# Patient Record
Sex: Female | Born: 1980 | Race: Black or African American | Hispanic: No | Marital: Married | Smoking: Never smoker
Health system: Southern US, Community
[De-identification: ages and names within clinical notes are randomized; demographics above are authoritative.]

## PROBLEM LIST (undated history)

## (undated) DIAGNOSIS — C50912 Malignant neoplasm of unspecified site of left female breast: Secondary | ICD-10-CM

## (undated) DIAGNOSIS — I509 Heart failure, unspecified: Secondary | ICD-10-CM

## (undated) DIAGNOSIS — I158 Other secondary hypertension: Secondary | ICD-10-CM

## (undated) DIAGNOSIS — D649 Anemia, unspecified: Secondary | ICD-10-CM

## (undated) DIAGNOSIS — T50905A Adverse effect of unspecified drugs, medicaments and biological substances, initial encounter: Secondary | ICD-10-CM

## (undated) HISTORY — PX: BREAST BIOPSY: SHX20

---
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
## 2013-04-18 ENCOUNTER — Ambulatory Visit (INDEPENDENT_AMBULATORY_CARE_PROVIDER_SITE_OTHER): Payer: 59 | Admitting: Family Medicine

## 2013-04-18 VITALS — BP 110/78 | HR 80 | Temp 98.1°F | Resp 18 | Ht 63.0 in | Wt 151.0 lb

## 2013-04-18 DIAGNOSIS — R51 Headache: Secondary | ICD-10-CM

## 2013-04-18 DIAGNOSIS — N912 Amenorrhea, unspecified: Secondary | ICD-10-CM

## 2013-04-18 DIAGNOSIS — H538 Other visual disturbances: Secondary | ICD-10-CM

## 2013-04-18 LAB — GLUCOSE, POCT (MANUAL RESULT ENTRY): POC Glucose: 70 mg/dl (ref 70–99)

## 2013-04-18 LAB — POCT URINE PREGNANCY: Preg Test, Ur: NEGATIVE

## 2013-04-18 NOTE — Patient Instructions (Addendum)
Take Tylenol or ibuprofen for the headache  Return at any time if worse  We are scheduling you for a CT scan of your head, and someone should contact you in the next day or so as to when that is scheduled for.

## 2013-04-18 NOTE — Progress Notes (Signed)
Subjective: 32 year old female who has a history of having headaches for the past month. She had some visual problems 2 so she saw an eye doctor and got glasses. She's been wearing the glasses. She was doing okay with them, but her last week she's noticed more visual blurring on and off. She has intermittent headaches, more on the right parietal occipital area. No nausea or vomiting. She says her menses are very irregular, that being tired is a possibility and her. She has been under some stress with family members staying with them currently, but it's not been any problem. 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 She has not had any slurring of speech or coordination issues. The headache has been persistent but not accelerating.  Objective: Her TMs are normal. Eyes PERRLA. EOMs intact. Fundi benign with discs sharp. Throat clear. Neck supple without nodes thyromegaly. No carotid bruits. Chest clear. Heart regular without murmurs. Romberg negative. Finger to nose normal. Tandem walk normal. Cranial nerves grossly intact.  Assessment: Headache Visual blurring  Plan: Check a urine hCG to make sure she's not pregnant. Check glucose on her. Will then try and decide whether she needs a scan are not.   Results for orders placed in visit on 04/18/13  POCT URINE PREGNANCY      Result Value Range   Preg Test, Ur Negative    GLUCOSE, POCT (MANUAL RESULT ENTRY)      Result Value Range   POC Glucose 70  70 - 99 mg/dl   see orders. CT scan of head ordered.

## 2013-04-25 ENCOUNTER — Ambulatory Visit
Admission: RE | Admit: 2013-04-25 | Discharge: 2013-04-25 | Disposition: A | Discharge status: Home / Self Care | Payer: 59 | Source: Ambulatory Visit | Attending: Family Medicine | Admitting: Family Medicine

## 2013-04-25 DIAGNOSIS — R51 Headache: Secondary | ICD-10-CM

## 2013-04-25 DIAGNOSIS — H538 Other visual disturbances: Secondary | ICD-10-CM

## 2015-07-03 ENCOUNTER — Emergency Department (HOSPITAL_COMMUNITY)
Admission: EM | Admit: 2015-07-03 | Discharge: 2015-07-03 | Discharge status: Against Medical Advice | Payer: Managed Care, Other (non HMO) | Attending: Emergency Medicine | Admitting: Emergency Medicine

## 2015-07-03 ENCOUNTER — Encounter (HOSPITAL_COMMUNITY): Payer: Self-pay | Admitting: Emergency Medicine

## 2015-07-03 ENCOUNTER — Encounter (HOSPITAL_COMMUNITY): Payer: Self-pay

## 2015-07-03 ENCOUNTER — Inpatient Hospital Stay (HOSPITAL_COMMUNITY)
Admission: AD | Admit: 2015-07-03 | Discharge: 2015-07-03 | Disposition: A | Discharge status: Home / Self Care | Payer: Managed Care, Other (non HMO) | Source: Ambulatory Visit | Attending: Obstetrics & Gynecology | Admitting: Obstetrics & Gynecology

## 2015-07-03 DIAGNOSIS — G8918 Other acute postprocedural pain: Secondary | ICD-10-CM

## 2015-07-03 DIAGNOSIS — R109 Unspecified abdominal pain: Secondary | ICD-10-CM | POA: Insufficient documentation

## 2015-07-03 LAB — URINALYSIS, ROUTINE W REFLEX MICROSCOPIC
Bilirubin Urine: NEGATIVE
Glucose, UA: NEGATIVE mg/dL
Hgb urine dipstick: NEGATIVE
Ketones, ur: NEGATIVE mg/dL
LEUKOCYTES UA: NEGATIVE
NITRITE: NEGATIVE
Protein, ur: NEGATIVE mg/dL
SPECIFIC GRAVITY, URINE: 1.025 (ref 1.005–1.030)
UROBILINOGEN UA: 0.2 mg/dL (ref 0.0–1.0)
pH: 6 (ref 5.0–8.0)

## 2015-07-03 NOTE — ED Notes (Signed)
PT DECIDED TO LEAVE AMA.  STATED SHE WAS GOING TO CALL 911 FROM HOME.

## 2015-07-03 NOTE — MAU Note (Signed)
Pt states she had a c-section Monday at [facility]. Felt burning across her stomach and "something split internally" an hour ago. Originally went to [facility] and was told to come here.

## 2015-07-03 NOTE — Discharge Instructions (Signed)

## 2015-07-03 NOTE — MAU Provider Note (Signed)
  History     CSN: [id number]  Arrival date and time: 07/03/15 0109   First Provider Initiated Contact with Patient 07/03/15 0335      No chief complaint on file.  HPI Comments: Darlene Kline is a 34 y.o. [id number] who presents today with abdominal pain. She had a repeat c-section on Monday at [facility] with Dr. Earleen Newport. She states that tonight she had a "tearing  Pain". She states that she last took ibuprofen at 1800 yesterday. She has percocet for pain, but she has not taken any of that yesterday or today (8/11 or 8/12). She also states that she has been "doing a lot" at home. She states that her bleeding is very minimal. She denies any fever.   Abdominal Pain This is a new problem. The current episode started today. The onset quality is sudden. The problem has been gradually improving. The pain is located in the suprapubic region. The pain is at a severity of 5/10. The quality of the pain is sharp. The abdominal pain does not radiate. Associated symptoms include nausea. Pertinent negatives include no constipation, diarrhea, dysuria, fever, frequency or vomiting. The pain is aggravated by movement. The pain is relieved by recumbency. She has tried nothing for the symptoms. Her past medical history is significant for abdominal surgery.     No past medical history on file.  Past Surgical History  Procedure Laterality Date  . Cesarean section      No family history on file.  Social History  Substance Use Topics  . Smoking status: Never Smoker   . Smokeless tobacco: Not on file  . Alcohol Use: No    Allergies: No Known Allergies  No prescriptions prior to admission    Review of Systems  Constitutional: Negative for fever.  Gastrointestinal: Positive for nausea and abdominal pain. Negative for vomiting, diarrhea and constipation.  Genitourinary: Negative for dysuria, urgency and frequency.   Physical Exam   Blood pressure 139/90, pulse 87, temperature 98.3 F (36.8  C), temperature source Oral, resp. rate 16, height 5\' 2"  (1.575 m), weight 83.643 kg (184 lb 6.4 oz), SpO2 100 %.  Physical Exam  Nursing note and vitals reviewed. Constitutional: She is oriented to person, place, and time. She appears well-developed and well-nourished. No distress.  HENT:  Head: Normocephalic.  Cardiovascular: Normal rate.   Respiratory: Effort normal.  GI: Soft. There is no tenderness. There is no rebound.  c-section incision is well healed.   Neurological: She is alert and oriented to person, place, and time.  Skin: Skin is warm and dry.  Psychiatric: She has a normal mood and affect.    MAU Course  Procedures  MDM Patient offered pain medication here. She states that she would rather go home and take the medication she has at home.   Assessment and Plan   1. Post-op pain    Advised patient to decrease activity and increase rest Take ibuprofen on a schedule, and take percocet as needed Call OB in the morning if pain persists  DC home Comfort measures reviewed  RX: none, continue meds at home as prescribed.  Return to MAU as needed FU with OB as planned  Follow-up Information    Call Darlene Braun, MD.   Specialty:  Obstetrics and Gynecology   Contact information:   [address] [place] [address] [phone]          Mathis Bud 07/03/2015, 3:36 AM

## 2015-07-03 NOTE — ED Notes (Signed)
Pt. reports low abdominal pain with nausea onset onset this evening , denies emesis or diarrhea , no fever or chills.

## 2015-07-03 NOTE — MAU Note (Signed)
Pt offered pain medication here by CNM but states she has some at home and would rather take it there

## 2016-11-21 HISTORY — PX: PORTA CATH INSERTION: CATH118285

## 2017-07-04 DIAGNOSIS — N6342 Unspecified lump in left breast, subareolar: Secondary | ICD-10-CM | POA: Insufficient documentation

## 2017-07-13 DIAGNOSIS — R928 Other abnormal and inconclusive findings on diagnostic imaging of breast: Secondary | ICD-10-CM | POA: Insufficient documentation

## 2017-08-02 DIAGNOSIS — B3731 Acute candidiasis of vulva and vagina: Secondary | ICD-10-CM | POA: Insufficient documentation

## 2017-08-02 DIAGNOSIS — N898 Other specified noninflammatory disorders of vagina: Secondary | ICD-10-CM | POA: Insufficient documentation

## 2017-08-02 DIAGNOSIS — B373 Candidiasis of vulva and vagina: Secondary | ICD-10-CM | POA: Insufficient documentation

## 2017-08-09 DIAGNOSIS — K219 Gastro-esophageal reflux disease without esophagitis: Secondary | ICD-10-CM | POA: Insufficient documentation

## 2017-08-09 DIAGNOSIS — R928 Other abnormal and inconclusive findings on diagnostic imaging of breast: Secondary | ICD-10-CM | POA: Insufficient documentation

## 2017-09-01 DIAGNOSIS — Z09 Encounter for follow-up examination after completed treatment for conditions other than malignant neoplasm: Secondary | ICD-10-CM | POA: Insufficient documentation

## 2017-09-01 DIAGNOSIS — E860 Dehydration: Secondary | ICD-10-CM | POA: Insufficient documentation

## 2017-12-15 ENCOUNTER — Other Ambulatory Visit: Payer: Self-pay | Admitting: General Surgery

## 2017-12-15 ENCOUNTER — Ambulatory Visit
Admission: RE | Admit: 2017-12-15 | Discharge: 2017-12-15 | Disposition: A | Discharge status: Home / Self Care | Payer: Managed Care, Other (non HMO) | Source: Ambulatory Visit | Attending: General Surgery | Admitting: General Surgery

## 2017-12-15 DIAGNOSIS — Z853 Personal history of malignant neoplasm of breast: Secondary | ICD-10-CM

## 2017-12-20 ENCOUNTER — Other Ambulatory Visit: Payer: Self-pay | Admitting: General Surgery

## 2017-12-21 NOTE — H&P (Signed)
Subjective:     Patient ID: Darlene Kline is a 37 y.o. female.  HPI  Here for follow up discussion breast reconstruction prior to planned bilateral mastectomies. Presented with palpable left breast mass. MMG revealed a mass in the left retroareolar region with microcalcifications extending from the mass to the base of the nipple spanning 4.8 cm from the posterior border of the mass to the nipple base.US showed an irregular mass at the 530 position of the left breast 2 cmfn measuring 1.8 x 2.9 x 3 cm with  duct extension to the nipple base. An additional mass at 3 o'clock position of the left breast 1 cmfn measuring 0.7 x 1.3 x 1.7 cm located 1.2 cm from the larger mass. [place] of axilla demonstrated at least 2 abnormal LN. Biopsy of 5:30 mass with IDC, ER/PR+, Her2 -. LN biopsy positive for metastatic disease.  invasive ductal carcinoma at the 530 o'clock position. Additional biopsy 2 o clock with UDH.   Genetics with VUS CHEK2. Staging scans negative for distant disease.  Neoadjuvant chemotherapy completed 1.18.19. Final MRI noted indeterminate 5 mm enhancing mass in UIQ RIGHT breast; second look [place] recommended. Near complete resolution of the enhancing masses in the left breast and normal size of the abnormal left axillary LN.  PMRT is anticipated, pending final pathology.  Current 36 C, notes left larger than left and reports right "more like a B." Wt up 10 lb since diagnosis.  Lives with husband and keds ages 41 and 43. 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  Review of Systems     Objective:   Physical Exam  Cardiovascular: Normal rate, regular rhythm and normal heart sounds.   Pulmonary/Chest: Effort normal and breath sounds normal.  Abdominal: Soft.  Striae present, umbilical piercing present, no panniculus, small volume soft tissue  Lymphadenopathy:    She has no axillary adenopathy.  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 Discussed used of ADM in reconstruction, cadaveric source. Reviewed risks of ADM including seroma, in case of infection may require removal. Reviewed data suggests that prepectoral placement with ADM may incur less of radiated associated complications.  Reviewed incisions, drains, OR length, hospital stay and post procedure limitations. Discussed process of expansion and implant based risks including rupture, MRI surveillance for silicone implants, infection requiring surgery or removal, contracture.   Discussed future surgery dependent on adjuvant treatments. This includes radiation- discussed this significantly increases risk reconstruction including wound healing problems capsular contracture. Expanders will likely be in place for several months (approximately 6 months from end of radiation) before continuing reconstruction process. At that time could do implant exchange alone, implant exchange with LD flap for radiated chest, or coversion to autologous.   Reviewed SSM will be asensate and not stimulate. Reviewed risk mastectomy flap necrosis requiring additional surgery.  Rx for post mastectomy supplies given.   Irene Limbo, MD [facility] Plastic & Reconstructive Surgery [phone], pin [phone]

## 2017-12-29 NOTE — Pre-Procedure Instructions (Signed)
Darlene Kline  12/29/2017      [id number] - [place], [place] - [address] AT [address] [place] [address] Phone: [phone] Fax: [phone]    Your procedure is scheduled on February 15  Report to [facility] at 1025 A.M.  Call this number if you have problems the morning of surgery:  [phone]   Remember:  Do not eat food or drink liquids after midnight.   Continue all medications as directed by your physician except follow these medication instructions before surgery below   Take these medicines the morning of surgery with A SIP OF WATER loratadine (CLARITIN)  7 days prior to surgery STOP taking any Aspirin(unless otherwise instructed by your surgeon), Aleve, Naproxen, Ibuprofen, Motrin, Advil, Goody's, BC's, all herbal medications, fish oil, and all vitamins     Do not wear jewelry, make-up or nail polish.  Do not wear lotions, powders, or perfumes, or deodorant.  Do not shave 48 hours prior to surgery.  Men may shave face and neck.  Do not bring valuables to the hospital.  [facility] is not responsible for any belongings or valuables.  Contacts, dentures or bridgework may not be worn into surgery.  Leave your suitcase in the car.  After surgery it may be brought to your room.  For patients admitted to the hospital, discharge time will be determined by your treatment team.  Patients discharged the day of surgery will not be allowed to drive home.    Special instructions:   [facility]- Preparing For Surgery  Before surgery, you can play an important role. Because skin is not sterile, your skin needs to be as free of germs as possible. You can reduce the number of germs on your skin by washing with CHG (chlorahexidine gluconate) Soap before surgery.  CHG is an antiseptic cleaner which kills germs and bonds with the skin to continue killing germs even after washing.  Please do  not use if you have an allergy to CHG or antibacterial soaps. If your skin becomes reddened/irritated stop using the CHG.  Do not shave (including legs and underarms) for at least 48 hours prior to first CHG shower. It is OK to shave your face.  Please follow these instructions carefully.   1. Shower the NIGHT BEFORE SURGERY and the MORNING OF SURGERY with CHG.   2. If you chose to wash your hair, wash your hair first as usual with your normal shampoo.  3. After you shampoo, rinse your hair and body thoroughly to remove the shampoo.  4. Use CHG as you would any other liquid soap. You can apply CHG directly to the skin and wash gently with a scrungie or a clean washcloth.   5. Apply the CHG Soap to your body ONLY FROM THE NECK DOWN.  Do not use on open wounds or open sores. Avoid contact with your eyes, ears, mouth and genitals (private parts). Wash Face and genitals (private parts)  with your normal soap.  6. Wash thoroughly, paying special attention to the area where your surgery will be performed.  7. Thoroughly rinse your body with warm water from the neck down.  8. DO NOT shower/wash with your normal soap after using and rinsing off the CHG Soap.  9. Pat yourself dry with a CLEAN TOWEL.  10. Wear CLEAN PAJAMAS to bed the night before surgery, wear comfortable clothes the morning of surgery  11. Place CLEAN SHEETS  on your bed the night of your first shower and DO NOT SLEEP WITH PETS.    Day of Surgery: Do not apply any deodorants/lotions. Please wear clean clothes to the hospital/surgery center.      Please read over the following fact sheets that you were given.

## 2018-01-01 ENCOUNTER — Encounter (HOSPITAL_COMMUNITY): Payer: Self-pay | Admitting: Surgery

## 2018-01-01 ENCOUNTER — Other Ambulatory Visit: Payer: Self-pay

## 2018-01-01 ENCOUNTER — Encounter (HOSPITAL_COMMUNITY)
Admission: RE | Admit: 2018-01-01 | Discharge: 2018-01-01 | Disposition: A | Discharge status: Home / Self Care | Payer: Managed Care, Other (non HMO) | Source: Ambulatory Visit | Attending: General Surgery | Admitting: General Surgery

## 2018-01-01 DIAGNOSIS — C50912 Malignant neoplasm of unspecified site of left female breast: Secondary | ICD-10-CM | POA: Diagnosis not present

## 2018-01-01 DIAGNOSIS — Z01812 Encounter for preprocedural laboratory examination: Secondary | ICD-10-CM | POA: Diagnosis present

## 2018-01-01 LAB — HEMOGLOBIN: Hemoglobin: 9.8 g/dL — ABNORMAL LOW (ref 12.0–15.0)

## 2018-01-01 NOTE — Progress Notes (Signed)
Patient is aware of change in surgery date and time  Instructed patient to arrive at 40 on the 14th

## 2018-01-01 NOTE — Progress Notes (Signed)
PCP -  Nelva Bush Cardiologist - none  Chest x-ray - not needed EKG - not needed  Anesthesia review:  No  Patient denies shortness of breath, fever, cough and chest pain at PAT appointment   Patient verbalized understanding of instructions that were given to them at the PAT appointment. Patient was also instructed that they will need to review over the PAT instructions again at home before surgery.

## 2018-01-04 ENCOUNTER — Ambulatory Visit (HOSPITAL_COMMUNITY): Payer: Managed Care, Other (non HMO) | Admitting: Anesthesiology

## 2018-01-04 ENCOUNTER — Other Ambulatory Visit: Payer: Self-pay

## 2018-01-04 ENCOUNTER — Observation Stay (HOSPITAL_COMMUNITY)
Admission: RE | Admit: 2018-01-04 | Discharge: 2018-01-06 | Disposition: A | Discharge status: Home / Self Care | Payer: Managed Care, Other (non HMO) | Source: Ambulatory Visit | Attending: General Surgery | Admitting: General Surgery

## 2018-01-04 ENCOUNTER — Encounter (HOSPITAL_COMMUNITY): Payer: Self-pay | Admitting: *Deleted

## 2018-01-04 ENCOUNTER — Encounter (HOSPITAL_COMMUNITY)
Admission: RE | Disposition: A | Discharge status: Home / Self Care | Payer: Self-pay | Source: Ambulatory Visit | Attending: General Surgery

## 2018-01-04 DIAGNOSIS — C50412 Malignant neoplasm of upper-outer quadrant of left female breast: Principal | ICD-10-CM | POA: Insufficient documentation

## 2018-01-04 DIAGNOSIS — D6489 Other specified anemias: Secondary | ICD-10-CM | POA: Diagnosis not present

## 2018-01-04 DIAGNOSIS — Z4001 Encounter for prophylactic removal of breast: Secondary | ICD-10-CM | POA: Insufficient documentation

## 2018-01-04 DIAGNOSIS — D62 Acute posthemorrhagic anemia: Secondary | ICD-10-CM | POA: Insufficient documentation

## 2018-01-04 DIAGNOSIS — Z9221 Personal history of antineoplastic chemotherapy: Secondary | ICD-10-CM | POA: Diagnosis not present

## 2018-01-04 DIAGNOSIS — C773 Secondary and unspecified malignant neoplasm of axilla and upper limb lymph nodes: Secondary | ICD-10-CM | POA: Diagnosis not present

## 2018-01-04 DIAGNOSIS — Z791 Long term (current) use of non-steroidal anti-inflammatories (NSAID): Secondary | ICD-10-CM | POA: Insufficient documentation

## 2018-01-04 DIAGNOSIS — Z79899 Other long term (current) drug therapy: Secondary | ICD-10-CM | POA: Insufficient documentation

## 2018-01-04 DIAGNOSIS — N6091 Unspecified benign mammary dysplasia of right breast: Secondary | ICD-10-CM | POA: Diagnosis not present

## 2018-01-04 DIAGNOSIS — Z170 Estrogen receptor positive status [ER+]: Secondary | ICD-10-CM | POA: Diagnosis not present

## 2018-01-04 DIAGNOSIS — C50911 Malignant neoplasm of unspecified site of right female breast: Secondary | ICD-10-CM | POA: Diagnosis present

## 2018-01-04 DIAGNOSIS — R509 Fever, unspecified: Secondary | ICD-10-CM | POA: Diagnosis not present

## 2018-01-04 DIAGNOSIS — D649 Anemia, unspecified: Secondary | ICD-10-CM | POA: Insufficient documentation

## 2018-01-04 HISTORY — PX: MASTECTOMY: SHX3

## 2018-01-04 HISTORY — DX: Malignant neoplasm of unspecified site of left female breast: C50.912

## 2018-01-04 HISTORY — PX: NIPPLE SPARING MASTECTOMY: SHX6537

## 2018-01-04 HISTORY — PX: RECONSTRUCTION BREAST IMMEDIATE / DELAYED W/ TISSUE EXPANDER: SUR1077

## 2018-01-04 HISTORY — PX: MASTECTOMY MODIFIED RADICAL: SHX5962

## 2018-01-04 LAB — POCT PREGNANCY, URINE: PREG TEST UR: NEGATIVE

## 2018-01-04 SURGERY — MASTECTOMY, MODIFIED RADICAL
Anesthesia: General | Site: Breast | Laterality: Right

## 2018-01-04 MED ORDER — SUGAMMADEX SODIUM 200 MG/2ML IV SOLN
INTRAVENOUS | Status: AC
  Filled 2018-01-04: qty 2

## 2018-01-04 MED ORDER — FENTANYL CITRATE (PF) 100 MCG/2ML IJ SOLN
INTRAMUSCULAR | Status: DC | PRN
  Administered 2018-01-04: 50 ug via INTRAVENOUS
  Administered 2018-01-04: 25 ug via INTRAVENOUS
  Administered 2018-01-04 (×5): 50 ug via INTRAVENOUS
  Administered 2018-01-04: 25 ug via INTRAVENOUS

## 2018-01-04 MED ORDER — ONDANSETRON HCL 4 MG/2ML IJ SOLN
4.0000 mg | Freq: Four times a day (QID) | INTRAMUSCULAR | Status: DC | PRN
  Administered 2018-01-04 – 2018-01-06 (×2): 4 mg via INTRAVENOUS
  Filled 2018-01-04 (×2): qty 2

## 2018-01-04 MED ORDER — CHLORHEXIDINE GLUCONATE CLOTH 2 % EX PADS: 6.0000 | MEDICATED_PAD | Freq: Once | CUTANEOUS | Status: DC

## 2018-01-04 MED ORDER — HYDROMORPHONE HCL 1 MG/ML IJ SOLN
INTRAMUSCULAR | Status: AC
  Administered 2018-01-04: 0.5 mg via INTRAVENOUS
  Filled 2018-01-04: qty 1

## 2018-01-04 MED ORDER — CEFAZOLIN SODIUM 1 G IJ SOLR
INTRAMUSCULAR | Status: AC
  Filled 2018-01-04: qty 20

## 2018-01-04 MED ORDER — DEXAMETHASONE SODIUM PHOSPHATE 10 MG/ML IJ SOLN
INTRAMUSCULAR | Status: AC
  Filled 2018-01-04: qty 1

## 2018-01-04 MED ORDER — OXYCODONE HCL 5 MG PO TABS
ORAL_TABLET | ORAL | Status: AC
  Filled 2018-01-04: qty 2

## 2018-01-04 MED ORDER — EPHEDRINE SULFATE-NACL 50-0.9 MG/10ML-% IV SOSY
PREFILLED_SYRINGE | INTRAVENOUS | Status: DC | PRN
  Administered 2018-01-04 (×4): 5 mg via INTRAVENOUS

## 2018-01-04 MED ORDER — MEPERIDINE HCL 50 MG/ML IJ SOLN: 6.2500 mg | INTRAMUSCULAR | Status: DC | PRN

## 2018-01-04 MED ORDER — GABAPENTIN 300 MG PO CAPS
300.0000 mg | ORAL_CAPSULE | ORAL | Status: AC
  Administered 2018-01-04: 300 mg via ORAL

## 2018-01-04 MED ORDER — HYDROMORPHONE HCL 1 MG/ML IJ SOLN
0.2500 mg | INTRAMUSCULAR | Status: DC | PRN
  Administered 2018-01-04 (×3): 0.5 mg via INTRAVENOUS

## 2018-01-04 MED ORDER — OXYCODONE HCL 5 MG PO TABS: 5.0000 mg | ORAL_TABLET | Freq: Once | ORAL | Status: DC | PRN

## 2018-01-04 MED ORDER — SUGAMMADEX SODIUM 200 MG/2ML IV SOLN
INTRAVENOUS | Status: DC | PRN
  Administered 2018-01-04: 200 mg via INTRAVENOUS

## 2018-01-04 MED ORDER — KETOROLAC TROMETHAMINE 15 MG/ML IJ SOLN
INTRAMUSCULAR | Status: AC
  Administered 2018-01-04: 15 mg via INTRAVENOUS
  Filled 2018-01-04: qty 1

## 2018-01-04 MED ORDER — KETOROLAC TROMETHAMINE 15 MG/ML IJ SOLN
15.0000 mg | INTRAMUSCULAR | Status: DC
  Administered 2018-01-04: 15 mg via INTRAVENOUS

## 2018-01-04 MED ORDER — GABAPENTIN 300 MG PO CAPS
ORAL_CAPSULE | ORAL | Status: AC
  Administered 2018-01-04: 300 mg via ORAL
  Filled 2018-01-04: qty 1

## 2018-01-04 MED ORDER — HEMOSTATIC AGENTS (NO CHARGE) OPTIME
TOPICAL | Status: DC | PRN
  Administered 2018-01-04: 1 via TOPICAL

## 2018-01-04 MED ORDER — ROCURONIUM BROMIDE 100 MG/10ML IV SOLN
INTRAVENOUS | Status: DC | PRN
  Administered 2018-01-04: 20 mg via INTRAVENOUS
  Administered 2018-01-04: 50 mg via INTRAVENOUS
  Administered 2018-01-04: 10 mg via INTRAVENOUS

## 2018-01-04 MED ORDER — SODIUM CHLORIDE 0.9 % IJ SOLN
INTRAMUSCULAR | Status: AC
  Filled 2018-01-04: qty 10

## 2018-01-04 MED ORDER — OXYCODONE HCL 5 MG PO TABS
5.0000 mg | ORAL_TABLET | ORAL | Status: DC | PRN
  Administered 2018-01-04 (×2): 10 mg via ORAL
  Administered 2018-01-05: 5 mg via ORAL
  Administered 2018-01-05 – 2018-01-06 (×9): 10 mg via ORAL
  Filled 2018-01-04 (×11): qty 2

## 2018-01-04 MED ORDER — MIDAZOLAM HCL 2 MG/2ML IJ SOLN
INTRAMUSCULAR | Status: AC
  Filled 2018-01-04: qty 2

## 2018-01-04 MED ORDER — ENOXAPARIN SODIUM 40 MG/0.4ML ~~LOC~~ SOLN
40.0000 mg | SUBCUTANEOUS | Status: DC
  Administered 2018-01-04: 40 mg via SUBCUTANEOUS
  Filled 2018-01-04: qty 0.4

## 2018-01-04 MED ORDER — PROMETHAZINE HCL 25 MG/ML IJ SOLN
6.2500 mg | INTRAMUSCULAR | Status: DC | PRN
  Administered 2018-01-04: 6.25 mg via INTRAVENOUS

## 2018-01-04 MED ORDER — METHOCARBAMOL 500 MG PO TABS
500.0000 mg | ORAL_TABLET | Freq: Four times a day (QID) | ORAL | Status: DC | PRN
  Administered 2018-01-04 – 2018-01-06 (×3): 500 mg via ORAL
  Filled 2018-01-04 (×2): qty 1

## 2018-01-04 MED ORDER — PHENYLEPHRINE 40 MCG/ML (10ML) SYRINGE FOR IV PUSH (FOR BLOOD PRESSURE SUPPORT)
PREFILLED_SYRINGE | INTRAVENOUS | Status: AC
  Filled 2018-01-04: qty 10

## 2018-01-04 MED ORDER — LACTATED RINGERS IV SOLN
INTRAVENOUS | Status: DC | PRN
  Administered 2018-01-04 (×2): via INTRAVENOUS

## 2018-01-04 MED ORDER — SODIUM CHLORIDE 0.9 % IV SOLN
INTRAVENOUS | Status: DC
  Administered 2018-01-04: 16:00:00 via INTRAVENOUS

## 2018-01-04 MED ORDER — ACETAMINOPHEN 325 MG PO TABS
650.0000 mg | ORAL_TABLET | Freq: Four times a day (QID) | ORAL | Status: DC | PRN
Start: 2018-01-04 — End: 2018-01-06
  Administered 2018-01-06 (×3): 650 mg via ORAL
  Filled 2018-01-04 (×3): qty 2

## 2018-01-04 MED ORDER — PHENYLEPHRINE HCL 10 MG/ML IJ SOLN
INTRAVENOUS | Status: DC | PRN
  Administered 2018-01-04: 10 ug/min via INTRAVENOUS

## 2018-01-04 MED ORDER — MIDAZOLAM HCL 5 MG/5ML IJ SOLN
INTRAMUSCULAR | Status: DC | PRN
  Administered 2018-01-04: 2 mg via INTRAVENOUS

## 2018-01-04 MED ORDER — MORPHINE SULFATE (PF) 4 MG/ML IV SOLN: 2.0000 mg | INTRAVENOUS | Status: DC | PRN

## 2018-01-04 MED ORDER — CEFAZOLIN SODIUM-DEXTROSE 2-4 GM/100ML-% IV SOLN
INTRAVENOUS | Status: AC
  Filled 2018-01-04: qty 100

## 2018-01-04 MED ORDER — ENSURE PRE-SURGERY PO LIQD: 592.0000 mL | Freq: Once | ORAL | Status: DC

## 2018-01-04 MED ORDER — DEXAMETHASONE SODIUM PHOSPHATE 10 MG/ML IJ SOLN
INTRAMUSCULAR | Status: DC | PRN
  Administered 2018-01-04: 10 mg via INTRAVENOUS

## 2018-01-04 MED ORDER — ACETAMINOPHEN 500 MG PO TABS
1000.0000 mg | ORAL_TABLET | ORAL | Status: AC
  Administered 2018-01-04: 1000 mg via ORAL

## 2018-01-04 MED ORDER — CEFAZOLIN SODIUM-DEXTROSE 2-4 GM/100ML-% IV SOLN
2.0000 g | INTRAVENOUS | Status: AC
  Administered 2018-01-04 (×2): 2 g via INTRAVENOUS

## 2018-01-04 MED ORDER — OXYCODONE HCL 5 MG/5ML PO SOLN: 5.0000 mg | Freq: Once | ORAL | Status: DC | PRN

## 2018-01-04 MED ORDER — POVIDONE-IODINE 10 % EX SOLN
CUTANEOUS | Status: DC | PRN
  Administered 2018-01-04 (×2): 1 via TOPICAL

## 2018-01-04 MED ORDER — PROMETHAZINE HCL 25 MG/ML IJ SOLN
INTRAMUSCULAR | Status: AC
  Administered 2018-01-04: 6.25 mg via INTRAVENOUS
  Filled 2018-01-04: qty 1

## 2018-01-04 MED ORDER — ACETAMINOPHEN 500 MG PO TABS
ORAL_TABLET | ORAL | Status: AC
  Administered 2018-01-04: 1000 mg via ORAL
  Filled 2018-01-04: qty 2

## 2018-01-04 MED ORDER — 0.9 % SODIUM CHLORIDE (POUR BTL) OPTIME
TOPICAL | Status: DC | PRN
  Administered 2018-01-04 (×3): 1000 mL

## 2018-01-04 MED ORDER — FENTANYL CITRATE (PF) 250 MCG/5ML IJ SOLN
INTRAMUSCULAR | Status: AC
  Filled 2018-01-04: qty 5

## 2018-01-04 MED ORDER — ONDANSETRON HCL 4 MG/2ML IJ SOLN
INTRAMUSCULAR | Status: DC | PRN
  Administered 2018-01-04: 4 mg via INTRAVENOUS

## 2018-01-04 MED ORDER — ONDANSETRON 4 MG PO TBDP: 4.0000 mg | ORAL_TABLET | Freq: Four times a day (QID) | ORAL | Status: DC | PRN

## 2018-01-04 MED ORDER — TRAZODONE HCL 50 MG PO TABS
50.0000 mg | ORAL_TABLET | Freq: Every evening | ORAL | Status: DC | PRN
  Administered 2018-01-05: 50 mg via ORAL
  Filled 2018-01-04: qty 1

## 2018-01-04 MED ORDER — METHOCARBAMOL 500 MG PO TABS
ORAL_TABLET | ORAL | Status: AC
  Filled 2018-01-04: qty 1

## 2018-01-04 MED ORDER — HYDROMORPHONE HCL 1 MG/ML IJ SOLN
INTRAMUSCULAR | Status: AC
  Filled 2018-01-04: qty 1

## 2018-01-04 MED ORDER — LIDOCAINE HCL (CARDIAC) 20 MG/ML IV SOLN
INTRAVENOUS | Status: DC | PRN
  Administered 2018-01-04: 20 mg via INTRAVENOUS

## 2018-01-04 MED ORDER — PROPOFOL 10 MG/ML IV BOLUS
INTRAVENOUS | Status: AC
Start: 2018-01-04 — End: ?
  Filled 2018-01-04: qty 20

## 2018-01-04 MED ORDER — LORATADINE 10 MG PO TABS: 10.0000 mg | ORAL_TABLET | Freq: Every day | ORAL | Status: DC | PRN

## 2018-01-04 MED ORDER — ONDANSETRON HCL 4 MG/2ML IJ SOLN
INTRAMUSCULAR | Status: AC
  Filled 2018-01-04: qty 2

## 2018-01-04 MED ORDER — ROPIVACAINE HCL 5 MG/ML IJ SOLN
INTRAMUSCULAR | Status: DC | PRN
  Administered 2018-01-04: 40 mL via PERINEURAL

## 2018-01-04 MED ORDER — ACETAMINOPHEN 650 MG RE SUPP
650.0000 mg | Freq: Four times a day (QID) | RECTAL | Status: DC | PRN
Start: 2018-01-04 — End: 2018-01-06

## 2018-01-04 MED ORDER — PROPOFOL 10 MG/ML IV BOLUS
INTRAVENOUS | Status: DC | PRN
  Administered 2018-01-04: 120 mg via INTRAVENOUS

## 2018-01-04 MED ORDER — SIMETHICONE 80 MG PO CHEW: 40.0000 mg | CHEWABLE_TABLET | Freq: Four times a day (QID) | ORAL | Status: DC | PRN

## 2018-01-04 MED ORDER — SODIUM CHLORIDE 0.9 % IV SOLN
Freq: Once | INTRAVENOUS | Status: AC
  Administered 2018-01-04: 1000 mL
  Filled 2018-01-04: qty 1

## 2018-01-04 MED ORDER — EPHEDRINE 5 MG/ML INJ
INTRAVENOUS | Status: AC
  Filled 2018-01-04: qty 10

## 2018-01-04 SURGICAL SUPPLY — 74 items
ALLODERM 16X20 PERFORATED (Tissue) ×2 IMPLANT
ALLOGRAFT PERF 16X20 2.4+/-0.4 (Tissue) ×8 IMPLANT
APPLIER CLIP 9.375 MED OPEN (MISCELLANEOUS) ×10
BAG DECANTER FOR FLEXI CONT (MISCELLANEOUS) ×5 IMPLANT
BINDER BREAST XLRG (GAUZE/BANDAGES/DRESSINGS) ×5 IMPLANT
CANISTER SUCT 3000ML PPV (MISCELLANEOUS) ×5 IMPLANT
CHLORAPREP W/TINT 26ML (MISCELLANEOUS) ×10 IMPLANT
CLIP APPLIE 9.375 MED OPEN (MISCELLANEOUS) ×6 IMPLANT
CONT SPEC 4OZ CLIKSEAL STRL BL (MISCELLANEOUS) ×10 IMPLANT
COVER SURGICAL LIGHT HANDLE (MISCELLANEOUS) ×5 IMPLANT
DERMABOND ADVANCED (GAUZE/BANDAGES/DRESSINGS) ×2
DERMABOND ADVANCED .7 DNX12 (GAUZE/BANDAGES/DRESSINGS) ×3 IMPLANT
DRAIN CHANNEL 15F RND FF W/TCR (WOUND CARE) ×10 IMPLANT
DRAIN CHANNEL 19F RND (DRAIN) ×15 IMPLANT
DRAPE HALF SHEET 40X57 (DRAPES) ×10 IMPLANT
DRAPE LAPAROSCOPIC ABDOMINAL (DRAPES) IMPLANT
DRAPE ORTHO SPLIT 77X108 STRL (DRAPES) ×4
DRAPE SURG ORHT 6 SPLT 77X108 (DRAPES) ×6 IMPLANT
DRAPE UTILITY XL STRL (DRAPES) ×5 IMPLANT
DRAPE WARM FLUID 44X44 (DRAPE) ×5 IMPLANT
DRSG TEGADERM 4X4.75 (GAUZE/BANDAGES/DRESSINGS) ×30 IMPLANT
ELECT BLADE 4.0 EZ CLEAN MEGAD (MISCELLANEOUS) ×5
ELECT BLADE 6.5 EXT (BLADE) ×5 IMPLANT
ELECT CAUTERY BLADE 6.4 (BLADE) ×5 IMPLANT
ELECT COATED BLADE 2.86 ST (ELECTRODE) ×5 IMPLANT
ELECT REM PT RETURN 9FT ADLT (ELECTROSURGICAL) ×5
ELECTRODE BLDE 4.0 EZ CLN MEGD (MISCELLANEOUS) ×3 IMPLANT
ELECTRODE REM PT RTRN 9FT ADLT (ELECTROSURGICAL) ×3 IMPLANT
EVACUATOR SILICONE 100CC (DRAIN) ×25 IMPLANT
EXPANDER BREAST TISSUE 300CC (Breast) ×10 IMPLANT
GLOVE BIO SURGEON STRL SZ 6 (GLOVE) ×15 IMPLANT
GLOVE BIO SURGEON STRL SZ7 (GLOVE) ×20 IMPLANT
GLOVE BIOGEL PI IND STRL 7.5 (GLOVE) ×15 IMPLANT
GLOVE BIOGEL PI IND STRL 8.5 (GLOVE) ×9 IMPLANT
GLOVE BIOGEL PI INDICATOR 7.5 (GLOVE) ×10
GLOVE BIOGEL PI INDICATOR 8.5 (GLOVE) ×6
GLOVE ECLIPSE 7.5 STRL STRAW (GLOVE) ×10 IMPLANT
GLOVE ECLIPSE 8.0 STRL XLNG CF (GLOVE) ×10 IMPLANT
GLOVE SURG SS PI 6.5 STRL IVOR (GLOVE) ×10 IMPLANT
GOWN STRL REUS W/ TWL LRG LVL3 (GOWN DISPOSABLE) ×18 IMPLANT
GOWN STRL REUS W/ TWL XL LVL3 (GOWN DISPOSABLE) ×9 IMPLANT
GOWN STRL REUS W/TWL LRG LVL3 (GOWN DISPOSABLE) ×12
GOWN STRL REUS W/TWL XL LVL3 (GOWN DISPOSABLE) ×6
HEMOSTAT SNOW SURGICEL 2X4 (HEMOSTASIS) ×5 IMPLANT
KIT BASIN OR (CUSTOM PROCEDURE TRAY) ×5 IMPLANT
KIT TURNOVER KIT B (KITS) ×5 IMPLANT
LIGHT WAVEGUIDE WIDE FLAT (MISCELLANEOUS) ×5 IMPLANT
MARKER SKIN DUAL TIP RULER LAB (MISCELLANEOUS) ×5 IMPLANT
NS IRRIG 1000ML POUR BTL (IV SOLUTION) ×15 IMPLANT
PACK GENERAL/GYN (CUSTOM PROCEDURE TRAY) ×5 IMPLANT
PAD ABD 8X10 STRL (GAUZE/BANDAGES/DRESSINGS) ×15 IMPLANT
PAD ARMBOARD 7.5X6 YLW CONV (MISCELLANEOUS) ×10 IMPLANT
PIN SAFETY STERILE (MISCELLANEOUS) ×15 IMPLANT
SET ASEPTIC TRANSFER (MISCELLANEOUS) ×5 IMPLANT
SOLUTION BETADINE 4OZ (MISCELLANEOUS) ×10 IMPLANT
SPECIMEN JAR X LARGE (MISCELLANEOUS) ×10 IMPLANT
SPONGE LAP 18X18 5 PK (GAUZE/BANDAGES/DRESSINGS) ×25 IMPLANT
SPONGE LAP 18X18 X RAY DECT (DISPOSABLE) ×5 IMPLANT
STAPLER VISISTAT 35W (STAPLE) ×5 IMPLANT
SUT CHROMIC 4 0 PS 2 18 (SUTURE) ×30 IMPLANT
SUT ETHILON 2 0 FS 18 (SUTURE) ×25 IMPLANT
SUT MNCRL AB 4-0 PS2 18 (SUTURE) ×10 IMPLANT
SUT SILK 2 0 SH (SUTURE) ×5 IMPLANT
SUT VIC AB 0 CT2 27 (SUTURE) ×25 IMPLANT
SUT VIC AB 3-0 SH 18 (SUTURE) ×5 IMPLANT
SUT VIC AB 3-0 SH 27 (SUTURE) ×8
SUT VIC AB 3-0 SH 27X BRD (SUTURE) ×9 IMPLANT
SUT VIC AB 3-0 SH 27XBRD (SUTURE) ×3 IMPLANT
SUT VIC AB 4-0 PS2 27 (SUTURE) ×10 IMPLANT
SUT VLOC 180 0 24IN GS25 (SUTURE) ×10 IMPLANT
SYR BULB IRRIGATION 50ML (SYRINGE) ×10 IMPLANT
TOWEL GREEN STERILE FF (TOWEL DISPOSABLE) ×5 IMPLANT
TUBE CONNECTING 12'X1/4 (SUCTIONS) ×1
TUBE CONNECTING 12X1/4 (SUCTIONS) ×4 IMPLANT

## 2018-01-04 NOTE — Anesthesia Postprocedure Evaluation (Signed)
Anesthesia Post Note  Patient: Darlene Kline  Procedure(s) Performed: LEFT MASTECTOMY MODIFIED RADICAL (Left Breast) RIGHT PROPHYLACTIC NIPPLE SPARING MASTECTOMY (Right Breast) BILATERAL BREAST RECONSTRUCTION WITH PLACEMENT OF TISSUE EXPANDER AND ALLODERM (Bilateral Breast)     Patient location during evaluation: PACU Anesthesia Type: General Level of consciousness: awake and alert Pain management: pain level controlled Vital Signs Assessment: post-procedure vital signs reviewed and stable Respiratory status: spontaneous breathing, nonlabored ventilation and respiratory function stable Cardiovascular status: blood pressure returned to baseline and stable Postop Assessment: no apparent nausea or vomiting Anesthetic complications: no    Last Vitals:  Vitals:   01/04/18 1311 01/04/18 1326  BP: (!) 106/59 (!) 104/55  Pulse: (!) 105 (!) 108  Resp: 13 13  Temp:    SpO2: 100% 98%    Last Pain:  Vitals:   01/04/18 1301  TempSrc:   PainSc: Helen

## 2018-01-04 NOTE — Anesthesia Preprocedure Evaluation (Addendum)
Anesthesia Evaluation  Patient identified by MRN, date of birth, ID band Patient awake    Reviewed: Allergy & Precautions, NPO status , Patient's Chart, lab work & pertinent test results  Airway Mallampati: I  TM Distance: >3 FB Neck ROM: Full    Dental no notable dental hx. (+) Teeth Intact   Pulmonary neg pulmonary ROS,    Pulmonary exam normal breath sounds clear to auscultation       Cardiovascular negative cardio ROS Normal cardiovascular exam Rhythm:Regular Rate:Normal     Neuro/Psych negative neurological ROS  negative psych ROS   GI/Hepatic negative GI ROS, Neg liver ROS,   Endo/Other  negative endocrine ROS  Renal/GU negative Renal ROS  negative genitourinary   Musculoskeletal negative musculoskeletal ROS (+)   Abdominal   Peds negative pediatric ROS (+)  Hematology negative hematology ROS (+)   Anesthesia Other Findings Breast cancer  Reproductive/Obstetrics negative OB ROS                            Anesthesia Physical Anesthesia Plan  ASA: III  Anesthesia Plan: General   Post-op Pain Management: GA combined w/ Regional for post-op pain   Induction: Intravenous  PONV Risk Score and Plan: 3 and Ondansetron, Dexamethasone and Midazolam  Airway Management Planned: Oral ETT  Additional Equipment:   Intra-op Plan:   Post-operative Plan: Extubation in OR  Informed Consent: I have reviewed the patients History and Physical, chart, labs and discussed the procedure including the risks, benefits and alternatives for the proposed anesthesia with the patient or authorized representative who has indicated his/her understanding and acceptance.   Dental advisory given  Plan Discussed with: CRNA  Anesthesia Plan Comments:         Anesthesia Quick Evaluation

## 2018-01-04 NOTE — Op Note (Signed)
Operative Note   DATE OF OPERATION: 2.14.19  LOCATION: [facility]-observation  SURGICAL DIVISION: Plastic Surgery  PREOPERATIVE DIAGNOSES:  1. Left breast ca UOQ ER+ 2. Neoadjuvant chemotherapy  POSTOPERATIVE DIAGNOSES:  same  PROCEDURE:  1. Bilateral breast reconstruction with tissue expanders 2. Acellular dermis for breast reconstruction 600 cm2  SURGEON: Irene Limbo MD MBA  ASSISTANTCaryl Asp RNFA  ANESTHESIA:  General.   EBL: 225 ml for entire procedure  COMPLICATIONS: None immediate.   INDICATIONS FOR PROCEDURE:  The patient, Darlene Kline, is a 37 y.o. female born on [birth year], is here for immediate expander, ADM reconstruction following right nipple sparing mastectomy, left total mastectomy.   FINDINGS: Natrelle [id number]-11-T 300 ml tissue expanders placed bilateral, initial fill volume 300 ml air, RIGHT SN [id number] LEFT SN [id number]  DESCRIPTION OF PROCEDURE:  Following induction of general anesthesia, Foley catheter placed. The patient's operative site was prepped and draped in a sterile fashion. A time out was performed and all information was confirmed to be correct. Following completion of mastectomies, reconstruction began on right side.  The cavity was irrigated with solution containing Ancef, gentamicin, and bacitracin. Hemostasis was ensured. Laterally the mastectomy flap over posterior axillary line was advanced anteriorly and the subcutaneous tissue and superficial fascia was secured to pectoralis muscle and serratus muscle with 0-vicryl. A 19 Fr drain was placed in subcutaneous position laterally and a 15 Fr drain placed along inframammary fold. Each secured to skin with 2-0 nylon. Cavity irrigated with Betadine. The tissue expanders were prepared on back table prior in insertion. The expander was filled with air to 300 ml. Perforated acellular dermis was draped over anterior surface expander. The ADM was then secured to itself over posterior surface of  expander with 4-0 chromic suture. Redundant folds acellular dermis excised so that the ADM lied flat without folds over air filled expander.The expander was secured to fascia over lateral sternal borderwith a 3-0 vicryl. Thelateral tab was also secured to pectoralis muscle with 3-0 vicryl. The ADM was secured to pectoralis muscle and chest wall along inferior border at inframammary fold with 0 V-lock suture. Skin closure completedwith 3-0 vicryl in fascial layer and 4-0 vicryl in dermis. Skin closure completed with 4-0 monocryl subcuticular and tissue adhesive.  I then directed my attention to left chest where similar irrigation and drain placement completed. Laterally the mastectomy flap over posterior axillary line was the advanced anteriorly and the subcutaneous tissue and superficial fascia was secured to pectoralis muscle and serratus muscle with 0-vicryl. The axillary drain was thus isolated from remainder breast cavity. The prepared expander with ADM secured over anterior surface was placed in right chest and tabs secured to chest wall and pectoralis muscle with 3-0 vicryl suture. The acellular dermis at inframammary fold was secured to chest wall with 0 V-lock suture. Skin closure completedwith 3-0 vicryl in fascial layer and 4-0 vicryl in dermis. Skin closure completed with 4-0 monocryl subcuticular and tissue adhesive. Tegaderm dressings applied followed by dry dressing, breast binder.  Patient was awakened from anesthesia and transferred to PACU in stable condition.  SPECIMENS: none  DRAINS: 15 and 19 Fr JP in right and left breast, 19 Fr in left axilla  Irene Limbo, MD [facility] Plastic & Reconstructive Surgery [phone], pin [phone]

## 2018-01-04 NOTE — Interval H&P Note (Signed)
History and Physical Interval Note:  01/04/2018 6:58 AM  Darlene Kline  has presented today for surgery, with the diagnosis of LEFT BREAST CANCER  The various methods of treatment have been discussed with the patient and family. After consideration of risks, benefits and other options for treatment, the patient has consented to  Procedure(s): LEFT MASTECTOMY MODIFIED RADICAL (Left) RIGHT PROPHYLACTIC NIPPLE SPARING MASTECTOMY (Right) LEFT BREAST RECONSTRUCTION WITH PLACEMENT OF TISSUE EXPANDER AND ALLODERM POSSIBLE BILATERAL (Left) as a surgical intervention .  The patient's history has been reviewed, patient examined, no change in status, stable for surgery.  I have reviewed the patient's chart and labs.  Questions were answered to the patient's satisfaction.     [name]

## 2018-01-04 NOTE — Interval H&P Note (Signed)
History and Physical Interval Note:  01/04/2018 7:25 AM  Darlene Kline  has presented today for surgery, with the diagnosis of LEFT BREAST CANCER  The various methods of treatment have been discussed with the patient and family. After consideration of risks, benefits and other options for treatment, the patient has consented to  Procedure(s): LEFT MASTECTOMY MODIFIED RADICAL (Left) RIGHT PROPHYLACTIC NIPPLE SPARING MASTECTOMY (Right) LEFT BREAST RECONSTRUCTION WITH PLACEMENT OF TISSUE EXPANDER AND ALLODERM POSSIBLE BILATERAL (Left) as a surgical intervention .  The patient's history has been reviewed, patient examined, no change in status, stable for surgery.  I have reviewed the patient's chart and labs.  Questions were answered to the patient's satisfaction.     Rolm Bookbinder

## 2018-01-04 NOTE — H&P (Signed)
Darlene Kline is an 37 y.o. female.   Chief Complaint: breast cancer HPI: 3 yof referred by Dr Marcy Panning for left breast cancer. She initially was seen in August after she palpated a left breast mass. she had noted a mass during breastfeeding and partially went away. after she was done she noted a mass that came back and eventually had this evaluated. she underwent a mm which showed an irregular mass in the left breast. this was in retroareolar region. the patient does note some discharge at that time and a retracted nipple. she had calcifciations that extended from mass to nipple base that measure 4.8 cm. right breast was normal. on [place] there was a 3 cm mass at 530 2 cm from nipple. this had duct extension to nipple on [place]. [place] of left axilla showed an abnormal 1 cm node and another near there with cortical thickening. she underwent biopsy of breast mass and node. 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 this is er pos at 95%, pr pos at 67 and her 2 negative. she underwent staging scans that were negative. she had an mri also that had right breast nme that was negative and concordant on biopsy. the left breast has numerous irregular enhancing masses involving the central, lateral inferior left breast . the masses span at least 8.5x5x4.4 cm. there are likely either numerous satellite lesions or axillary nodes as well. she has been on tac and clinically she states her breast now appears normal and she does not note a mass anymore. she has now finished chemotherapy. mri shows near complete resolution of the left side and decreased nodal size. right side has small indeterminate mass that is recommended for second look [place]. she has seen plastics already as well.   Past Medical History:  Diagnosis Date  . Cancer [facility])    Breast    Past Surgical History:  Procedure Laterality Date  . CESAREAN SECTION    . DILATION AND CURETTAGE OF UTERUS  2000    History reviewed. No  pertinent family history. Social History:  reports that  has never smoked. she has never used smokeless tobacco. She reports that she drinks alcohol. She reports that she does not use drugs.  Allergies:  Allergies  Allergen Reactions  . Adhesive [Tape] Other (See Comments)    Sensitive to skin Hyperfix (white tape)     Medications Prior to Admission  Medication Sig Dispense Refill  . Emollient (RA ALPHA HYDROXY FACE CREAM EX) Apply 1 application topically at bedtime.    Javier Docker Oil 500 MG CAPS Take 500 mg by mouth daily.    Marland Kitchen loratadine (CLARITIN) 10 MG tablet Take 10 mg by mouth daily as needed for allergies.    . Multiple Vitamins-Minerals (WOMENS ONE DAILY) TABS Take 2 tablets by mouth daily.    Marland Kitchen OVER THE COUNTER MEDICATION Take 2 tablets by mouth daily. Liver focus otc supplement    . OVER THE COUNTER MEDICATION Apply 1 application topically 3 (three) times a week. Retinol 0.5 lotion    . Probiotic CAPS Take 2 capsules by mouth daily with supper.    Marland Kitchen SPIRULINA PO Take 800 mg by mouth every other day. blue majik otc supplement    . traZODone (DESYREL) 50 MG tablet Take 50 mg by mouth at bedtime as needed for sleep.    . TURMERIC PO Take 1 capsule by mouth daily.    . vitamin A 10000 UNIT capsule Take 10,000  Units by mouth daily.    Marland Kitchen ibuprofen (ADVIL,MOTRIN) 600 MG tablet Take 600 mg by mouth daily as needed for moderate pain.       Results for orders placed or performed during the hospital encounter of 01/04/18 (from the past 48 hour(s))  Pregnancy, urine POC     Status: None   Collection Time: 01/04/18  5:51 AM  Result Value Ref Range   Preg Test, Ur NEGATIVE NEGATIVE    Comment:        THE SENSITIVITY OF THIS METHODOLOGY IS >24 mIU/mL    No results found.  Review of Systems  All other systems reviewed and are negative.   Blood pressure 128/78, pulse 87, temperature 98.2 F (36.8 C), temperature source Oral, resp. rate 18, weight 73 kg (161 lb), SpO2 100  %. Physical Exam  Constitutional: She is oriented to person, place, and time. She appears well-developed and well-nourished.  HENT:  Head: Normocephalic and atraumatic.  Cardiovascular: Normal rate, regular rhythm, normal heart sounds and intact distal pulses.  Respiratory: Effort normal and breath sounds normal. Right breast exhibits no mass. Left breast exhibits no mass.  GI: Soft. There is no tenderness.  Lymphadenopathy:    She has no cervical adenopathy.  Neurological: She is alert and oriented to person, place, and time.  Skin: Skin is warm and dry.     Assessment/Plan Assessment & Plan Rolm Bookbinder MD; 12/15/2017 11:54 AM) BREAST CANCER OF UPPER-OUTER QUADRANT OF LEFT FEMALE BREAST (C50.412) Story: prophylactic right nsm, left mrm  looks like she has good response, I still think based on initial presentation she needs left mrm. I think proph reasonable. we discussed surgery and risks at length today.    Rolm Bookbinder, MD 01/04/2018, 7:10 AM

## 2018-01-04 NOTE — Op Note (Signed)
Preoperative diagnosis: left breast cancer s/p primary chemotherapy Postoperative diagnosis: same as above Procedure 1. Left modified radical mastectomy 2. Right prophylactic nipple sparing mastectomy Surgeon: Dr Serita Grammes Asst: Dr Irene Limbo Anesthesia general with bilateral pectoral block Specimens; 1. Right nsm marked short superior long lateral 2. Right nipple specimen 3. Left mrm short superior, long axillary contents Drains 19 Fr Blake drain to left axilla EBL: 150 cc Sponge and needle count correct times two dispo case turned over to plastic surgery for reconstruction  Indications: This is a 12 yof with node positive left cancer who has undergone primary chemotherapy. She has a great response.  Still needs a left mrm and she desired a right prophylactic mastectomy.   Procedure: After informed consent obtained patient was taken to OR. She had undergone bilateral pectoral blocks.  She was placed under general anesthesia.  She was prepped and draped in standard sterile surgical fashion.  A surgical timeout was performed.  I first did the right mastectomy.  I made a inframammary incision.  I then released the breast from the pectoralis muscle to the parasternal area, clavicle and latissimus laterally.   I then created the anterior flap. The breast tissue was removed.  I then removed the nipple and passed it off.  Hemostasis was obtained. The specimen was marked and passed off.  This was packed and I moved to other side.   I made elliptical incision to include the nipple and areola with effort to save skin.  I created flaps to the parasternal area, inframammary fold, clavicle and the latissimus laterally. I then removed the breast from the pectoralis muscle to include the fascia. I then entered the axilla. The axillary vein, thoracodorsal bundle and long thoracic were identified. I swept the axillary contents caudad from the vein.   I passed all this off the table once divided.  I obtained hemostasis. I placed a 19 Fr Blake drain in the axilla and secured this with a 2-0 nylon. The case was then turned over to plastic surgery for reconstruction.

## 2018-01-04 NOTE — Anesthesia Procedure Notes (Signed)
Procedure Name: Intubation Date/Time: 01/04/2018 7:42 AM Performed by: Kyung Rudd, CRNA Pre-anesthesia Checklist: Patient identified, Emergency Drugs available, Suction available and Patient being monitored Patient Re-evaluated:Patient Re-evaluated prior to induction Oxygen Delivery Method: Circle system utilized Preoxygenation: Pre-oxygenation with 100% oxygen Induction Type: IV induction Ventilation: Mask ventilation without difficulty Laryngoscope Size: Mac and 3 Grade View: Grade I Tube type: Oral Tube size: 7.0 mm Number of attempts: 1 Airway Equipment and Method: Stylet Placement Confirmation: ETT inserted through vocal cords under direct vision,  positive ETCO2 and breath sounds checked- equal and bilateral Secured at: 20 cm Tube secured with: Tape Dental Injury: Teeth and Oropharynx as per pre-operative assessment

## 2018-01-04 NOTE — Anesthesia Procedure Notes (Signed)
Anesthesia Regional Block: Pectoralis block   Pre-Anesthetic Checklist: ,, timeout performed, Correct Patient, Correct Site, Correct Laterality, Correct Procedure, Correct Position, site marked, Risks and benefits discussed,  Surgical consent,  Pre-op evaluation,  At surgeon's request and post-op pain management  Laterality: Left and Right  Prep: chloraprep       Needles:  Injection technique: Single-shot  Needle Type: Stimiplex     Needle Length: 9cm  Needle Gauge: 21     Additional Needles:   Procedures:,,,, ultrasound used (permanent image in chart),,,,  Narrative:  Start time: 01/04/2018 7:20 AM End time: 01/04/2018 7:25 AM Injection made incrementally with aspirations every 5 mL.  Performed by: Personally  Anesthesiologist: Lynda Rainwater, MD

## 2018-01-04 NOTE — Transfer of Care (Signed)
Immediate Anesthesia Transfer of Care Note  Patient: Darlene Kline  Procedure(s) Performed: LEFT MASTECTOMY MODIFIED RADICAL (Left Breast) RIGHT PROPHYLACTIC NIPPLE SPARING MASTECTOMY (Right Breast) BILATERAL BREAST RECONSTRUCTION WITH PLACEMENT OF TISSUE EXPANDER AND ALLODERM (Bilateral Breast)  Patient Location: PACU  Anesthesia Type:GA combined with regional for post-op pain  Level of Consciousness: awake, alert  and oriented  Airway & Oxygen Therapy: Patient Spontanous Breathing and Patient connected to nasal cannula oxygen  Post-op Assessment: Report given to RN, Post -op Vital signs reviewed and stable and Patient moving all extremities X 4  Post vital signs: Reviewed and stable  Last Vitals:  Vitals:   01/04/18 0604 01/04/18 0605  BP: 128/78   Pulse: 87   Resp:  18  Temp: 36.8 C   SpO2: 100%     Last Pain:  Vitals:   01/04/18 0604  TempSrc: Oral         Complications: No apparent anesthesia complications

## 2018-01-05 ENCOUNTER — Encounter (HOSPITAL_COMMUNITY): Payer: Self-pay | Admitting: General Surgery

## 2018-01-05 DIAGNOSIS — C50412 Malignant neoplasm of upper-outer quadrant of left female breast: Secondary | ICD-10-CM | POA: Diagnosis not present

## 2018-01-05 LAB — CBC
HEMATOCRIT: 23.3 % — AB (ref 36.0–46.0)
HEMOGLOBIN: 7.6 g/dL — AB (ref 12.0–15.0)
MCH: 32.5 pg (ref 26.0–34.0)
MCHC: 32.6 g/dL (ref 30.0–36.0)
MCV: 99.6 fL (ref 78.0–100.0)
Platelets: 261 10*3/uL (ref 150–400)
RBC: 2.34 MIL/uL — ABNORMAL LOW (ref 3.87–5.11)
RDW: 15.1 % (ref 11.5–15.5)
WBC: 7.6 10*3/uL (ref 4.0–10.5)

## 2018-01-05 LAB — BASIC METABOLIC PANEL
ANION GAP: 9 (ref 5–15)
BUN: 9 mg/dL (ref 6–20)
CHLORIDE: 105 mmol/L (ref 101–111)
CO2: 24 mmol/L (ref 22–32)
Calcium: 8.5 mg/dL — ABNORMAL LOW (ref 8.9–10.3)
Creatinine, Ser: 0.78 mg/dL (ref 0.44–1.00)
GFR calc Af Amer: >60 mL/min (ref >60)
GFR calc non Af Amer: >60 mL/min (ref >60)
GLUCOSE: 105 mg/dL — AB (ref 65–99)
POTASSIUM: 3.3 mmol/L — AB (ref 3.5–5.1)
SODIUM: 138 mmol/L (ref 135–145)

## 2018-01-05 MED ORDER — METHOCARBAMOL 500 MG PO TABS: 500.0000 mg | ORAL_TABLET | Freq: Three times a day (TID) | ORAL | 0 refills | Status: DC | PRN

## 2018-01-05 MED ORDER — OXYCODONE HCL 5 MG PO TABS: 5.0000 mg | ORAL_TABLET | ORAL | 0 refills | Status: DC | PRN

## 2018-01-05 MED ORDER — POTASSIUM CHLORIDE 20 MEQ PO PACK
40.0000 meq | PACK | Freq: Once | ORAL | Status: AC
  Administered 2018-01-05: 40 meq via ORAL
  Filled 2018-01-05: qty 2

## 2018-01-05 MED ORDER — SULFAMETHOXAZOLE-TRIMETHOPRIM 800-160 MG PO TABS: 1.0000 | ORAL_TABLET | Freq: Two times a day (BID) | ORAL | 0 refills | Status: DC

## 2018-01-05 NOTE — Progress Notes (Signed)
   POD # 1 right NSM, left total mastectomy with ALND, bilateral TE/ADM reconstruction  Temp:  [97.9 F (36.6 C)-98.7 F (37.1 C)] 98.7 F (37.1 C) (02/15 0949) Pulse Rate:  [92-121] 102 (02/15 0949) Resp:  [12-21] 16 (02/15 0949) BP: (98-120)/(42-75) 111/61 (02/15 0949) SpO2:  [91 %-100 %] 99 % (02/15 0535) Weight:  [77.9 kg (171 lb 11.8 oz)] 77.9 kg (171 lb 11.8 oz) (02/14 1550)   JPs  50/35/50/55/60 left axilla PO 420  PE: alert NAD Chest soft some ecchymoses over right UOQ, soft bilateral Drains serosanguinous  A/P Hb noted. Patient has been OOB to BR, states she got up too quick but otherwise no dizziness.  Will check back in PM- possible d/c this pm or in am.  Recommend start PNV at home for anemia.  Irene Limbo, MD [facility] [phone], pin [phone]

## 2018-01-05 NOTE — Progress Notes (Signed)
1 Day Post-Op   Subjective/Chief Complaint: Doing well, pain controlled fairly well, some nausea overnight, voiding   Objective: Vital signs in last 24 hours: Temp:  [97.9 F (36.6 C)-98.3 F (36.8 C)] 98 F (36.7 C) (02/15 0535) Pulse Rate:  [92-121] 96 (02/15 0535) Resp:  [12-21] 16 (02/15 0535) BP: (98-120)/(42-75) 103/42 (02/15 0535) SpO2:  [91 %-100 %] 99 % (02/15 0535) Weight:  [77.9 kg (171 lb 11.8 oz)] 77.9 kg (171 lb 11.8 oz) (02/14 1550) Last BM Date: 01/03/18  Intake/Output from previous day: 02/14 0701 - 02/15 0700 In: 1680 [P.O.:50; I.V.:1500] Out: 1005 [Urine:530; Drains:250; Blood:225] Intake/Output this shift: Total I/O In: 120 [P.O.:120] Out: -   all drains serosang, left side without any hematoma, right side with some ecchymosis but no real hematoma  Lab Results:  Recent Labs    01/05/18 0625  WBC 7.6  HGB 7.6*  HCT 23.3*  PLT 261   BMET Recent Labs    01/05/18 0625  NA 138  K 3.3*  CL 105  CO2 24  GLUCOSE 105*  BUN 9  CREATININE 0.78  CALCIUM 8.5*   PT/INR No results for input(s): LABPROT, INR in the last 72 hours. ABG No results for input(s): PHART, HCO3 in the last 72 hours.  Invalid input(s): PCO2, PO2  Studies/Results: No results found.  Anti-infectives: Anti-infectives (From admission, onward)   Start     Dose/Rate Route Frequency Ordered Stop   01/04/18 0730  bacitracin 50,000 Units, gentamicin (GARAMYCIN) 80 mg, ceFAZolin (ANCEF) 1 g in sodium chloride 0.9 % 1,000 mL      Irrigation Once 01/04/18 0722 01/04/18 1011   01/04/18 0604  ceFAZolin (ANCEF) 2-4 GM/100ML-% IVPB    Comments:  Hazlip, Jessica   : cabinet override      01/04/18 0604 01/04/18 0745   01/04/18 0559  ceFAZolin (ANCEF) IVPB 2g/100 mL premix     2 g 200 mL/hr over 30 Minutes Intravenous On call to O.R. 01/04/18 0559 01/04/18 1135      Assessment/Plan: POD 1 right nsm, left mrm I think hb lower due to blood loss at surgery and dilutional (was anemic  to start) no evidence active bleeding Regular diet Home later if does well  Rolm Bookbinder 01/05/2018

## 2018-01-06 DIAGNOSIS — C50412 Malignant neoplasm of upper-outer quadrant of left female breast: Secondary | ICD-10-CM | POA: Diagnosis not present

## 2018-01-06 LAB — URINALYSIS, ROUTINE W REFLEX MICROSCOPIC
BILIRUBIN URINE: NEGATIVE
Glucose, UA: NEGATIVE mg/dL
HGB URINE DIPSTICK: NEGATIVE
KETONES UR: NEGATIVE mg/dL
Leukocytes, UA: NEGATIVE
NITRITE: NEGATIVE
PH: 6 (ref 5.0–8.0)
Protein, ur: NEGATIVE mg/dL
SPECIFIC GRAVITY, URINE: 1.011 (ref 1.005–1.030)

## 2018-01-06 NOTE — Progress Notes (Signed)
Temp 102.9. On call provider notified. Tylenol given.

## 2018-01-06 NOTE — Progress Notes (Signed)
2 Days Post-Op   Subjective/Chief Complaint: No complaints, ambulated, not oob much   Objective: Vital signs in last 24 hours: Temp:  [97.7 F (36.5 C)-102.9 F (39.4 C)] 97.7 F (36.5 C) (02/16 0704) Pulse Rate:  [102-123] 123 (02/16 0545) Resp:  [16-17] 16 (02/16 0545) BP: (110-117)/(49-64) 117/62 (02/16 0545) SpO2:  [95 %-98 %] 95 % (02/16 0545) Last BM Date: 01/03/18  Intake/Output from previous day: 02/15 0701 - 02/16 0700 In: 760 [P.O.:760] Out: 345 [Drains:345] Intake/Output this shift: No intake/output data recorded.  Resp: clear to auscultation bilaterally Incision/Wound:bilateral incisions without infection, drains serosang as expected  Lab Results:  Recent Labs    01/05/18 0625  WBC 7.6  HGB 7.6*  HCT 23.3*  PLT 261   BMET Recent Labs    01/05/18 0625  NA 138  K 3.3*  CL 105  CO2 24  GLUCOSE 105*  BUN 9  CREATININE 0.78  CALCIUM 8.5*   PT/INR No results for input(s): LABPROT, INR in the last 72 hours. ABG No results for input(s): PHART, HCO3 in the last 72 hours.  Invalid input(s): PCO2, PO2  Studies/Results: No results found.  Anti-infectives: Anti-infectives (From admission, onward)   Start     Dose/Rate Route Frequency Ordered Stop   01/05/18 0000  sulfamethoxazole-trimethoprim (BACTRIM DS,SEPTRA DS) 800-160 MG tablet     1 tablet Oral 2 times daily 01/05/18 1038     01/04/18 0730  bacitracin 50,000 Units, gentamicin (GARAMYCIN) 80 mg, ceFAZolin (ANCEF) 1 g in sodium chloride 0.9 % 1,000 mL      Irrigation Once 01/04/18 0722 01/04/18 1011   01/04/18 0604  ceFAZolin (ANCEF) 2-4 GM/100ML-% IVPB    Comments:  Hazlip, Jessica   : cabinet override      01/04/18 0604 01/04/18 0745   01/04/18 0559  ceFAZolin (ANCEF) IVPB 2g/100 mL premix     2 g 200 mL/hr over 30 Minutes Intravenous On call to O.R. 01/04/18 0559 01/04/18 1135      Assessment/Plan: POD 2 right nsm, left mrm I think hb lower due to blood loss at surgery and dilutional  (was anemic to start) no evidence active bleeding Regular diet Fever likely atelectasis, will follow during day, pulm toilet, will check ua Home later if does well    Darlene Kline 01/06/2018

## 2018-01-06 NOTE — Progress Notes (Signed)
UA sent per order.

## 2018-01-06 NOTE — Progress Notes (Signed)
Husband here for ride home.  Pt understands and has demonstrated how to empty JP drains and record drainage and has all supplies for home.

## 2018-01-06 NOTE — Progress Notes (Signed)
Reviewed DC instructions with pt and copy given.  Pt has charts for keeping records of JP drainage.  She knows to take it to the follow up appointment with Dr. Donne Hazel.  Given Rxs and explained.  Also, given breast cancer bag and explained.  Talked about lymphedema precautions.  Follow up appointments reviewed.

## 2018-01-06 NOTE — Discharge Instructions (Signed)
[facility] [place] °[phone] ° °MASTECTOMY: POST OP INSTRUCTIONS ° °Always review your discharge instruction sheet given to you by the facility where your surgery was performed. °IF YOU HAVE DISABILITY OR FAMILY LEAVE FORMS, YOU MUST BRING THEM TO THE OFFICE FOR PROCESSING.   °DO NOT GIVE THEM TO YOUR DOCTOR. °A prescription for pain medication may be given to you upon discharge.  Take your pain medication as prescribed, if needed.  If narcotic pain medicine is not needed, then you may take acetaminophen (Tylenol), naprosyn (Alleve) or ibuprofen (Advil) as needed. °1. Take your usually prescribed medications unless otherwise directed. °2. If you need a refill on your pain medication, please contact your pharmacy.  They will contact our office to request authorization.  Prescriptions will not be filled after 5pm or on week-ends. °3. You should follow a light diet the first few days after arrival home, such as soup and crackers, etc.  Resume your normal diet the day after surgery. °4. Most patients will experience some swelling and bruising on the chest and underarm.  Ice packs will help.  Swelling and bruising can take several days to resolve. Wear the binder day and night until you return to the office.  °5. It is common to experience some constipation if taking pain medication after surgery.  Increasing fluid intake and taking a stool softener (such as Colace) will usually help or prevent this problem from occurring.  A mild laxative (Milk of Magnesia or Miralax) should be taken according to package instructions if there are no bowel movements after 48 hours. °6. Unless discharge instructions indicate otherwise, leave your bandage dry and in place until your next appointment in 3-5 days.  You may take a limited sponge bath.  No tube baths or showers until the drains are removed.  You may have steri-strips (small skin tapes) in place directly over the incision.  These strips should be left on the  skin for 7-10 days. If you have glue it will come off in next couple week.  Any sutures will be removed at an office visit °7. DRAINS:  If you have drains in place, it is important to keep a list of the amount of drainage produced each day in your drains.  Before leaving the hospital, you should be instructed on drain care.  Call our office if you have any questions about your drains. I will remove your drains when they put out less than 30 cc or ml for 2 consecutive days. °8. ACTIVITIES:  You may resume regular (light) daily activities beginning the next day--such as daily self-care, walking, climbing stairs--gradually increasing activities as tolerated.  You may have sexual intercourse when it is comfortable.  Refrain from any heavy lifting or straining until approved by your doctor. °a. You may drive when you are no longer taking prescription pain medication, you can comfortably wear a seatbelt, and you can safely maneuver your car and apply brakes. °b. RETURN TO WORK:  __________________________________________________________ °9. You should see your doctor in the office for a follow-up appointment approximately 3-5 days after your surgery.  Your doctor’s nurse will typically make your follow-up appointment when she calls you with your pathology report.  Expect your pathology report 3-4business days after surgery. °10. OTHER INSTRUCTIONS: ______________________________________________________________________________________________ ____________________________________________________________________________________________ °WHEN TO CALL YOUR DR Natale Thoma: °1. Fever over 101.0 °2. Nausea and/or vomiting °3. Extreme swelling or bruising °4. Continued bleeding from incision. °5. Increased pain, redness, or drainage from the incision. °The clinic staff is available   to answer your questions during regular business hours.  Please don’t hesitate to call and ask to speak to one of the nurses for clinical concerns.  If  you have a medical emergency, go to the nearest emergency room or call 911.  A surgeon from [facility] is always on call at the hospital. °[address], [address], [place], [place]  [address] ? [address] [address], [place], [place]   [address] °[phone] ? [phone] ? FAX [phone] °Web site: [email or web address] ° °

## 2018-01-11 DIAGNOSIS — Z9013 Acquired absence of bilateral breasts and nipples: Secondary | ICD-10-CM | POA: Insufficient documentation

## 2018-01-11 NOTE — Discharge Summary (Signed)
Physician Discharge Summary  Patient ID: Darlene Kline MRN: [id number] DOB/AGE: September 10, 1981 37 y.o.  Admit date: 01/04/2018 Discharge date: 01/11/2018  Admission Diagnoses: Left breast cancer Anemia  Discharge Diagnoses:  Left breast cancer s/p primary chemo  Discharged Condition: good  Hospital Course: 14 yof s/p primary chemotherapy for left breast cancer. Underwent left mrm and right risk reducing nipple sparing mastectomy.  She eventually did well with good pain control, tolerating regular diet, drains as expected.    Consults: None  Significant Diagnostic Studies: none  Treatments: surgery: left mrm, right nsm   Disposition: 01-Home or Self Care  Discharge Instructions    Call MD for:  redness, tenderness, or signs of infection (pain, swelling, bleeding, redness, odor or green/yellow discharge around incision site)   Complete by:  As directed    Call MD for:  temperature >100.5   Complete by:  As directed    Discharge instructions   Complete by:  As directed    Ok to remove dressings and shower am 2.16.19. Soap and water ok, pat Tegaderms dry. Do not remove Tegaderms. No creams or ointments over incisions. Do not let drains dangle in shower, attach to lanyard or similar.Strip and record drains twice daily and bring log to clinic visit.  Breast binder or soft compression bra all other times.  Ok to raise arms above shoulders for bathing and dressing.  No house yard work or exercise until cleared by MD. Recommend Miralax or dulcolax as needed for constipation. Recommend ibuprofen with meals as directed to help with pain.   Driving Restrictions   Complete by:  As directed    No driving for 2 weeks   Lifting restrictions   Complete by:  As directed    No lifting > 5 lbs until cleared by MD   Resume previous diet   Complete by:  As directed      Allergies as of 01/06/2018      Reactions   Adhesive [tape] Other (See Comments)   Sensitive to skin Hyperfix (white  tape)       Medication List    TAKE these medications   ibuprofen 600 MG tablet Commonly known as:  ADVIL,MOTRIN Take 600 mg by mouth daily as needed for moderate pain.   Krill Oil 500 MG Caps Take 500 mg by mouth daily.   loratadine 10 MG tablet Commonly known as:  CLARITIN Take 10 mg by mouth daily as needed for allergies.   methocarbamol 500 MG tablet Commonly known as:  ROBAXIN Take 1 tablet (500 mg total) by mouth every 8 (eight) hours as needed for muscle spasms.   OVER THE COUNTER MEDICATION Take 2 tablets by mouth daily. Liver focus otc supplement   OVER THE COUNTER MEDICATION Apply 1 application topically 3 (three) times a week. Retinol 0.5 lotion   oxyCODONE 5 MG immediate release tablet Commonly known as:  Oxy IR/ROXICODONE Take 1-2 tablets (5-10 mg total) by mouth every 4 (four) hours as needed for moderate pain.   Probiotic Caps Take 2 capsules by mouth daily with supper.   RA ALPHA HYDROXY FACE CREAM EX Apply 1 application topically at bedtime.   SPIRULINA PO Take 800 mg by mouth every other day. blue majik otc supplement   sulfamethoxazole-trimethoprim 800-160 MG tablet Commonly known as:  BACTRIM DS,SEPTRA DS Take 1 tablet by mouth 2 (two) times daily.   traZODone 50 MG tablet Commonly known as:  DESYREL Take 50 mg by mouth at bedtime as needed for  sleep.   TURMERIC PO Take 1 capsule by mouth daily.   vitamin A 10000 UNIT capsule Take 10,000 Units by mouth daily.   WOMENS ONE DAILY Tabs Take 2 tablets by mouth daily.      Follow-up Information    Rolm Bookbinder, MD Follow up.   Specialty:  General Surgery Contact information: [address] [place] [address] [phone]        Irene Limbo, MD Follow up in 1 week(s).   Specialty:  Plastic Surgery Why:  as scheduled Contact information: [facility] [place] [address] [phone]           Signed: Rolm Bookbinder 01/11/2018,  6:29 AM

## 2018-03-02 ENCOUNTER — Ambulatory Visit: Payer: Managed Care, Other (non HMO) | Admitting: Physical Therapy

## 2018-03-07 ENCOUNTER — Ambulatory Visit: Payer: Managed Care, Other (non HMO) | Attending: Plastic Surgery | Admitting: Physical Therapy

## 2018-03-07 ENCOUNTER — Other Ambulatory Visit: Payer: Self-pay

## 2018-03-07 DIAGNOSIS — Z9189 Other specified personal risk factors, not elsewhere classified: Secondary | ICD-10-CM | POA: Insufficient documentation

## 2018-03-07 DIAGNOSIS — Z483 Aftercare following surgery for neoplasm: Secondary | ICD-10-CM | POA: Insufficient documentation

## 2018-03-07 DIAGNOSIS — M25612 Stiffness of left shoulder, not elsewhere classified: Secondary | ICD-10-CM | POA: Diagnosis present

## 2018-03-07 NOTE — Therapy (Signed)
[place], [place], [address] Phone: [phone]   Fax:  [phone]  Physical Therapy Evaluation  Patient Details  Name: Darlene Kline MRN: [id number] Date of Birth: [birth year] Referring Provider: Dr. Irene Limbo   Encounter Date: 03/07/2018  PT End of Session - 03/07/18 1730    Visit Number  1    Number of Visits  3    Date for PT Re-Evaluation  04/06/18    PT Start Time  8315    PT Stop Time  1704    PT Time Calculation (min)  54 min    Activity Tolerance  Patient tolerated treatment well    Behavior During Therapy  [facility] for tasks assessed/performed       Past Medical History:  Diagnosis Date  . Breast cancer, left breast [facility])     Past Surgical History:  Procedure Laterality Date  . BREAST BIOPSY Left 07/2018  . CESAREAN SECTION  2013; 2017  . DILATION AND CURETTAGE OF UTERUS  2000  . MASTECTOMY Left 01/04/2018  . MASTECTOMY Right 01/04/2018   PROPHYLACTIC NIPPLE SPARING MASTECTOMY  . MASTECTOMY MODIFIED RADICAL Left 01/04/2018   Procedure: LEFT MASTECTOMY MODIFIED RADICAL;  Surgeon: Rolm Bookbinder, MD;  Location: [place];  Service: General;  Laterality: Left;  . NIPPLE SPARING MASTECTOMY Right 01/04/2018   Procedure: RIGHT PROPHYLACTIC NIPPLE SPARING MASTECTOMY;  Surgeon: Rolm Bookbinder, MD;  Location: [place];  Service: General;  Laterality: Right;  . PORTA CATH INSERTION Right 2018  . RECONSTRUCTION BREAST IMMEDIATE / DELAYED W/ TISSUE EXPANDER Bilateral 01/04/2018    AND ALLODERM    There were no vitals filed for this visit.   Subjective Assessment - 03/07/18 1612    Subjective  "Since I've had my double mastectomies, just to make sure I'm doing things to avoid lymphedema and to make sure I get back full ROM."  I used to work out quite a bit.    Pertinent History  Left breast cancer with two masses found; had neo-adjuvant chemotherapy, then double mastectomy with  immediate expander placement 01/04/18 with 12 lymph nodes removed. Has had two fills but right was deflated for radiation treatment. Is at her 10th treatment out of 28 now. Reconstruction surgery will be about 6 months after XRT and refill of right expander. Otherwise healthy.    Patient Stated Goals  make sure to avoid lymphedema, get back full ROM and back to workouts    Currently in Pain?  No/denies         [facility] PT Assessment - 03/07/18 0001      Assessment   Medical Diagnosis  left breast cancer s/p double mastectomies 01/04/18    Referring Provider  Dr. Irene Limbo    Onset Date/Surgical Date  01/04/18    Hand Dominance  Right    Prior Therapy  none      Precautions   Precautions  Other (comment)    Precaution Comments  cancer precautions; no restrictions      Restrictions   Weight Bearing Restrictions  No      Balance Screen   Has the patient fallen in the past 6 months  No    Has the patient had a decrease in activity level because of a fear of falling?   No    Is the patient reluctant to leave their home because of a fear of falling?   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 4/5  maximum resistance not given today      Palpation   Palpation comment  slight cording palpated at left axilla with arm in abduction; patient says she also feels it down her arm some says the cording has already improved      Ambulation/Gait   Ambulation/Gait  Yes    Ambulation/Gait Assistance  7: Independent        LYMPHEDEMA/ONCOLOGY QUESTIONNAIRE - 03/07/18 1636      Type   Cancer Type  left breast with bilateral mastectomies      Surgeries   Mastectomy Date  01/04/18 with immediate expander placement    Axillary Lymph Node Dissection Date  01/04/18    Number Lymph Nodes Removed  12      Treatment   Past Chemotherapy Treatment  Yes    Date  12/08/17    Active Radiation Treatment  Yes    Date  02/22/18 approx.    Body Site  left breast and lymph nodes      Lymphedema Assessments   Lymphedema Assessments  Upper extremities      Right Upper Extremity Lymphedema   10 cm Proximal to Olecranon Process  29.7 cm    Olecranon Process  24.7 cm    10 cm Proximal to Ulnar Styloid Process  21.8 cm    Just Proximal to Ulnar Styloid Process  15.3 cm    Across Hand at [employer]  18.6 cm    At [place] of 2nd Digit  6.1 cm      Left Upper Extremity Lymphedema   10 cm Proximal to Olecranon Process  29.6 cm    Olecranon Process  24.8 cm    10 cm Proximal to Ulnar Styloid Process  21.2 cm    Just Proximal to Ulnar Styloid Process  15.2 cm    Across Hand at [employer]  18.7 cm    At [place] of 2nd Digit  6 cm          Quick Dash - 03/07/18 0001    Open a tight or new jar  Moderate difficulty    Do heavy household chores (wash walls, wash floors)  Mild difficulty    Carry a shopping bag or briefcase  Mild difficulty    Wash your back  Mild difficulty    Use a knife to cut food  No difficulty    During the past week, to what  extent has your arm, shoulder or hand problem interfered with your normal social activities with family, friends, neighbors, or groups?  Slightly    During the past week, to what extent has your arm, shoulder or hand problem limited your work or other regular daily activities  Slightly    Arm, shoulder, or hand pain.  Mild    Tingling (pins and needles) in your arm, shoulder, or hand  Mild    Difficulty Sleeping  No difficulty    DASH Score  18.18 %        Objective measurements completed on examination: See above findings.              PT Education - 03/07/18 1727    Education provided  Yes    Education Details  about ABC class; about how to do scar mobilization for "knot" at left axilla; began lymphedema risk reduction education; about wearing compression sleeve for airplane flight and vigorous activity such as exercise    Person(s) Educated  Patient    Methods  Explanation;Handout NLN lymphedema risk reduction handout; Living Beyond Breast Cancer lymphedema pamphlet    Comprehension  Verbalized understanding          PT Long Term Goals - 03/07/18 1736      PT LONG TERM GOAL #1   Title  Pt. will be knowledgeable about lymphedema risk reduction practices.    Time  2    Period  Weeks    Status  New      PT LONG TERM GOAL #2   Title  Pt. will be independent in strength After Breast Cancer program    Time  2    Period  Weeks    Status  New      PT LONG TERM GOAL #3   Title  Pt. will be knowledgeable about safe self-progression of gym exercise and about stretches to do after she works out    Time  2    Period  Weeks    Status  New             Plan - 03/07/18 1731    Clinical Impression Statement  Very pleasant young woman about 2 months post double mastectomy for left breast cancer with immediate expander placement and 12 lymph nodes removed on left. Currently in radiation treatment and she had neo-adjuvant chemo.  She is very interested in learning what  she can to avoid developing lymphedema.  She is also anxious to return to working out, but very conscientious about wanting to do this safely. She already has a compression sleeve and gauntlet, though asked questions about when to wear it.    Clinical Decision Making  Low    Rehab Potential  Excellent    PT Frequency  1x / week    PT Duration  2 weeks    PT Treatment/Interventions  ADLs/Self Care Home Management;Therapeutic exercise;Patient/family education;Orthotic Fit/Training;Manual techniques;Scar mobilization;Passive range of motion    PT Next Visit Plan  Instruct in strength ABC program.  As time permits, do scar mobilization/soft tissue work to "knot" and cording at left axilla. Instruct in UE stretches to be done after her gym workouts (if needed beyond strength ABC stretches)    Recommended Other Services  ABC class       Patient will benefit from skilled therapeutic intervention in order to improve the following deficits and impairments:  Impaired UE functional use,  Decreased knowledge of precautions, Decreased knowledge of use of DME  Visit Diagnosis: Aftercare following surgery for neoplasm - Plan: PT plan of care cert/re-cert  At risk for lymphedema - Plan: PT plan of care cert/re-cert  Stiffness of left shoulder, not elsewhere classified - Plan: PT plan of care cert/re-cert     Problem List Patient Active Problem List   Diagnosis Date Noted  . Breast cancer, right ([place]) 01/04/2018    Bunyan Brier 03/07/2018, 5:38 PM  [place], [place], [address] Phone: [phone]   Fax:  [phone]  Name: Darlene Kline MRN: [id number] Date of Birth: 1981-03-28  Serafina Royals, PT 03/07/18 5:39 PM

## 2018-03-21 ENCOUNTER — Ambulatory Visit: Payer: Managed Care, Other (non HMO) | Attending: Plastic Surgery

## 2018-03-21 DIAGNOSIS — Z483 Aftercare following surgery for neoplasm: Secondary | ICD-10-CM

## 2018-03-21 DIAGNOSIS — M25612 Stiffness of left shoulder, not elsewhere classified: Secondary | ICD-10-CM | POA: Insufficient documentation

## 2018-03-21 DIAGNOSIS — Z9189 Other specified personal risk factors, not elsewhere classified: Secondary | ICD-10-CM | POA: Insufficient documentation

## 2018-03-21 NOTE — Therapy (Signed)
[place], [place], [address] Phone: [phone]   Fax:  [phone]  Physical Therapy Treatment  Patient Details  Name: Darlene Kline MRN: [id number] Date of Birth: 1981/08/26 Referring Provider: Dr. Irene Limbo   Encounter Date: 03/21/2018  PT End of Session - 03/21/18 1500    Visit Number  1 no charge for treatment so visit # unchanged    Number of Visits  3    Date for PT Re-Evaluation  04/06/18    PT Start Time  8416    PT Stop Time  1459    PT Time Calculation (min)  21 min    Activity Tolerance  Treatment limited secondary to medical complications (Comment)    Behavior During Therapy  [facility] for tasks assessed/performed       Past Medical History:  Diagnosis Date  . Breast cancer, left breast [facility])     Past Surgical History:  Procedure Laterality Date  . BREAST BIOPSY Left 07/2018  . CESAREAN SECTION  2013; 2017  . DILATION AND CURETTAGE OF UTERUS  2000  . MASTECTOMY Left 01/04/2018  . MASTECTOMY Right 01/04/2018   PROPHYLACTIC NIPPLE SPARING MASTECTOMY  . MASTECTOMY MODIFIED RADICAL Left 01/04/2018   Procedure: LEFT MASTECTOMY MODIFIED RADICAL;  Surgeon: Rolm Bookbinder, MD;  Location: [place];  Service: General;  Laterality: Left;  . NIPPLE SPARING MASTECTOMY Right 01/04/2018   Procedure: RIGHT PROPHYLACTIC NIPPLE SPARING MASTECTOMY;  Surgeon: Rolm Bookbinder, MD;  Location: [place];  Service: General;  Laterality: Right;  . PORTA CATH INSERTION Right 2018  . RECONSTRUCTION BREAST IMMEDIATE / DELAYED W/ TISSUE EXPANDER Bilateral 01/04/2018    AND ALLODERM    There were no vitals filed for this visit.  Subjective Assessment - 03/21/18 1439    Subjective  I've had a cold, I think it's just allergies but my nose has been runny alot.     Pertinent History  Left breast cancer with two masses found; had neo-adjuvant chemotherapy, then double mastectomy with immediate expander  placement 01/04/18 with 12 lymph nodes removed. Has had two fills but right was deflated for radiation treatment. Is at her 10th treatment out of 28 now. Reconstruction surgery will be about 6 months after XRT and refill of right expander. Otherwise healthy.    Patient Stated Goals  make sure to avoid lymphedema, get back full ROM and back to workouts    Currently in Pain?  No/denies                                    PT Long Term Goals - 03/07/18 1736      PT LONG TERM GOAL #1   Title  Pt. will be knowledgeable about lymphedema risk reduction practices.    Time  2    Period  Weeks    Status  New      PT LONG TERM GOAL #2   Title  Pt. will be independent in strength After Breast Cancer program    Time  2    Period  Weeks    Status  New      PT LONG TERM GOAL #3   Title  Pt. will be knowledgeable about safe self-progression of gym exercise and about stretches to do after she works out    Time  2    Period  Weeks    Status  New  Plan - 03/21/18 1501    Clinical Impression Statement  At very beginning of session noticed pt had runny nose and she reported had been fighting a small cold thinking it was allergies and her nose had been runny alot. At that moment pt had to excuse herself to bathroom as her nose had started bleeding. She eventually got it to slow down (after more than 15 mins or being in and out of the bathroom) but neither her or I were comfortable trying to exercise after that for fear of it starting again. So decided against treatment today. Instructed pt to update Dr. Leland Johns if it continues or she has other symptoms like lightheaded or dizziness. She verbalized understanding. Session will be arrive, no charge.     PT Frequency  1x / week    PT Duration  2 weeks    PT Treatment/Interventions  ADLs/Self Care Home Management;Therapeutic exercise;Patient/family education;Orthotic Fit/Training;Manual techniques;Scar  mobilization;Passive range of motion    PT Next Visit Plan  Instruct in strength ABC program.  As time permits, do scar mobilization/soft tissue work to "knot" and cording at left axilla. Instruct in UE stretches to be done after her gym workouts (if needed beyond strength ABC stretches)    Consulted and Agree with Plan of Care  Patient       Patient will benefit from skilled therapeutic intervention in order to improve the following deficits and impairments:  Impaired UE functional use, Decreased knowledge of precautions, Decreased knowledge of use of DME  Visit Diagnosis: Aftercare following surgery for neoplasm  At risk for lymphedema  Stiffness of left shoulder, not elsewhere classified     Problem List Patient Active Problem List   Diagnosis Date Noted  . Breast cancer, right ([place]) 01/04/2018    Otelia Limes, PTA 03/21/2018, 3:06 PM  [place], [place], [address] Phone: [phone]   Fax:  [phone]  Name: Darlene Kline MRN: [id number] Date of Birth: [birth year]

## 2018-03-28 ENCOUNTER — Ambulatory Visit: Payer: Managed Care, Other (non HMO)

## 2018-03-28 DIAGNOSIS — Z483 Aftercare following surgery for neoplasm: Secondary | ICD-10-CM | POA: Diagnosis present

## 2018-03-28 DIAGNOSIS — Z9189 Other specified personal risk factors, not elsewhere classified: Secondary | ICD-10-CM

## 2018-03-28 DIAGNOSIS — M25612 Stiffness of left shoulder, not elsewhere classified: Secondary | ICD-10-CM | POA: Diagnosis present

## 2018-03-28 NOTE — Therapy (Addendum)
[place], [place], [address] Phone: [phone]   Fax:  [phone]  Physical Therapy Treatment  Patient Details  Name: Darlene Kline MRN: [id number] Date of Birth: 02-24-1981 Referring Provider: Dr. Irene Limbo   Encounter Date: 03/28/2018  PT End of Session - 03/28/18 1720    Visit Number  2    Number of Visits  3    Date for PT Re-Evaluation  04/06/18    PT Start Time  1611    PT Stop Time  8527    PT Time Calculation (min)  64 min    Activity Tolerance  Patient tolerated treatment well    Behavior During Therapy  [facility] for tasks assessed/performed       Past Medical History:  Diagnosis Date  . Breast cancer, left breast [facility])     Past Surgical History:  Procedure Laterality Date  . BREAST BIOPSY Left 07/2018  . CESAREAN SECTION  2013; 2017  . DILATION AND CURETTAGE OF UTERUS  2000  . MASTECTOMY Left 01/04/2018  . MASTECTOMY Right 01/04/2018   PROPHYLACTIC NIPPLE SPARING MASTECTOMY  . MASTECTOMY MODIFIED RADICAL Left 01/04/2018   Procedure: LEFT MASTECTOMY MODIFIED RADICAL;  Surgeon: Rolm Bookbinder, MD;  Location: [place];  Service: General;  Laterality: Left;  . NIPPLE SPARING MASTECTOMY Right 01/04/2018   Procedure: RIGHT PROPHYLACTIC NIPPLE SPARING MASTECTOMY;  Surgeon: Rolm Bookbinder, MD;  Location: [place];  Service: General;  Laterality: Right;  . PORTA CATH INSERTION Right 2018  . RECONSTRUCTION BREAST IMMEDIATE / DELAYED W/ TISSUE EXPANDER Bilateral 01/04/2018    AND ALLODERM    There were no vitals filed for this visit.  Subjective Assessment - 03/28/18 1614    Subjective  I ended up going to the ED that night after I saw you. I called the doctor because you told me if it didn't stop bleeding to call my doctor. So I called them right before 5 and she (nurse) said it meant my platelets were low and it won't stop bleeding so that's how I ended up at the ED. I was given a  nose spray to use if it happens again which thankfully so far it hasn't.     Pertinent History  Left breast cancer with two masses found; had neo-adjuvant chemotherapy, then double mastectomy with immediate expander placement 01/04/18 with 12 lymph nodes removed. Has had two fills but right was deflated for radiation treatment. Is at her 10th treatment out of 28 now. Reconstruction surgery will be about 6 months after XRT and refill of right expander. Otherwise healthy.    Patient Stated Goals  make sure to avoid lymphedema, get back full ROM and back to workouts    Currently in Pain?  No/denies                       [facility] Adult PT Treatment/Exercise - 03/28/18 0001      Self-Care   Self-Care  Other Self-Care Comments    Other Self-Care Comments   Educated pt in lymphedema risk reductions and infection prevention, answering her questions throughout and issue handouts from ABC class.      Shoulder Exercises: Standing   Other Standing Exercises  Instructed pt in Strength ABC Program today getting through all stretches (1 rep, 15 sec holds each) and strength, using 1 lb weight with strengthening exercises and 10x each. Pt returned excellent demo of all reporting exercising again felt good.Also educated pt in  regards to safely progressing weights by starting "low and slow": low weights and 1x10, then if no symptoms noticed can increase to 2 then 3x10, once there can increase to next increment of weights but back to 1x10 with each increase. Pt verbalized understanding this.                   PT Long Term Goals - 03/07/18 1736      PT LONG TERM GOAL #1   Title  Pt. will be knowledgeable about lymphedema risk reduction practices.    Time  2    Period  Weeks    Status  New      PT LONG TERM GOAL #2   Title  Pt. will be independent in strength After Breast Cancer program    Time  2    Period  Weeks    Status  New      PT LONG TERM GOAL #3   Title  Pt. will be  knowledgeable about safe self-progression of gym exercise and about stretches to do after she works out    Time  2    Period  Weeks    Status  New            Plan - 03/28/18 1721    Clinical Impression Statement  Pt did very well with Stength ABC Program today. She reported enjoying being able to exercise some again and was encouraged by end of session by knowing how to progress weights safely with regards to being at risk for lymphedema. Also educated pt how to keep lymphedema risk low and infection prevention, issung ABC class handouts at end of session. Pt verbalized good understanding of all being very engaged throughout session asking appropriate questions throughout about all topics addressed.     Rehab Potential  Excellent    PT Frequency  1x / week    PT Duration  2 weeks    PT Treatment/Interventions  ADLs/Self Care Home Management;Therapeutic exercise;Patient/family education;Orthotic Fit/Training;Manual techniques;Scar mobilization;Passive range of motion    PT Next Visit Plan  Review prn strength ABC program.  As time permits, do scar mobilization/soft tissue work to "knot" and cording at left axilla. Instruct in UE stretches to be done after her gym workouts (if needed beyond strength ABC stretches)    Consulted and Agree with Plan of Care  Patient       Patient will benefit from skilled therapeutic intervention in order to improve the following deficits and impairments:  Impaired UE functional use, Decreased knowledge of precautions, Decreased knowledge of use of DME  Visit Diagnosis: Aftercare following surgery for neoplasm  At risk for lymphedema  Stiffness of left shoulder, not elsewhere classified     Problem List Patient Active Problem List   Diagnosis Date Noted  . Breast cancer, right ([place]) 01/04/2018    Otelia Limes, PTA 03/28/2018, 5:25 PM  [place],  [place], [address] Phone: [phone]   Fax:  [phone]  Name: Darlene Kline MRN: [id number] Date of Birth: 1981-03-19  PHYSICAL THERAPY DISCHARGE SUMMARY  Visits from Start of Care: 2 Current functional level related to goals / functional outcomes: See above. Patient did not return for her 3rd visit.   Remaining deficits: Unknown   Education / Equipment: HEP and lymphedema risk reduction information Plan: Patient agrees to discharge.  Patient goals were not met. Patient is being discharged due to not returning since the last visit.  ?????  Annia Friendly, PT 10/01/18 1:20 PM

## 2018-04-05 DIAGNOSIS — Z923 Personal history of irradiation: Secondary | ICD-10-CM | POA: Insufficient documentation

## 2018-04-06 ENCOUNTER — Encounter

## 2018-04-09 ENCOUNTER — Ambulatory Visit: Payer: Managed Care, Other (non HMO)

## 2018-04-11 ENCOUNTER — Encounter (HOSPITAL_COMMUNITY): Payer: Self-pay

## 2018-04-11 ENCOUNTER — Other Ambulatory Visit: Payer: Self-pay

## 2018-04-11 ENCOUNTER — Emergency Department (HOSPITAL_COMMUNITY)
Admission: EM | Admit: 2018-04-11 | Discharge: 2018-04-11 | Disposition: A | Discharge status: Home / Self Care | Payer: Managed Care, Other (non HMO) | Attending: Emergency Medicine | Admitting: Emergency Medicine

## 2018-04-11 DIAGNOSIS — C50911 Malignant neoplasm of unspecified site of right female breast: Secondary | ICD-10-CM | POA: Diagnosis not present

## 2018-04-11 DIAGNOSIS — Z79899 Other long term (current) drug therapy: Secondary | ICD-10-CM | POA: Diagnosis not present

## 2018-04-11 DIAGNOSIS — R040 Epistaxis: Secondary | ICD-10-CM | POA: Insufficient documentation

## 2018-04-11 DIAGNOSIS — D696 Thrombocytopenia, unspecified: Secondary | ICD-10-CM | POA: Insufficient documentation

## 2018-04-11 LAB — CBC WITH DIFFERENTIAL/PLATELET
Abs Immature Granulocytes: 0 10*3/uL (ref 0.0–0.1)
Basophils Absolute: 0 10*3/uL (ref 0.0–0.1)
Basophils Relative: 1 %
EOS ABS: 0.2 10*3/uL (ref 0.0–0.7)
Eosinophils Relative: 4 %
HEMATOCRIT: 31.7 % — AB (ref 36.0–46.0)
Hemoglobin: 10.2 g/dL — ABNORMAL LOW (ref 12.0–15.0)
Immature Granulocytes: 0 %
LYMPHS ABS: 0.8 10*3/uL (ref 0.7–4.0)
Lymphocytes Relative: 13 %
MCH: 26.6 pg (ref 26.0–34.0)
MCHC: 32.2 g/dL (ref 30.0–36.0)
MCV: 82.8 fL (ref 78.0–100.0)
Monocytes Absolute: 0.6 10*3/uL (ref 0.1–1.0)
Monocytes Relative: 11 %
NEUTROS PCT: 71 %
Neutro Abs: 4.2 10*3/uL (ref 1.7–7.7)
Platelets: 93 10*3/uL — ABNORMAL LOW (ref 150–400)
RBC: 3.83 MIL/uL — AB (ref 3.87–5.11)
RDW: 14.4 % (ref 11.5–15.5)
WBC: 5.8 10*3/uL (ref 4.0–10.5)

## 2018-04-11 LAB — COMPREHENSIVE METABOLIC PANEL
ALT: 20 U/L (ref 14–54)
AST: 39 U/L (ref 15–41)
Albumin: 3.7 g/dL (ref 3.5–5.0)
Alkaline Phosphatase: 73 U/L (ref 38–126)
Anion gap: 8 (ref 5–15)
BUN: 23 mg/dL — ABNORMAL HIGH (ref 6–20)
CHLORIDE: 103 mmol/L (ref 101–111)
CO2: 28 mmol/L (ref 22–32)
Calcium: 9.4 mg/dL (ref 8.9–10.3)
Creatinine, Ser: 0.77 mg/dL (ref 0.44–1.00)
Glucose, Bld: 103 mg/dL — ABNORMAL HIGH (ref 65–99)
POTASSIUM: 3.7 mmol/L (ref 3.5–5.1)
Sodium: 139 mmol/L (ref 135–145)
TOTAL PROTEIN: 7.7 g/dL (ref 6.5–8.1)
Total Bilirubin: 1 mg/dL (ref 0.3–1.2)

## 2018-04-11 MED ORDER — OXYMETAZOLINE HCL 0.05 % NA SOLN
2.0000 | Freq: Once | NASAL | Status: AC
  Administered 2018-04-11: 2 via NASAL
  Filled 2018-04-11: qty 15

## 2018-04-11 NOTE — Discharge Instructions (Signed)
Schedule an appointment with your oncologist for close follow-up regarding your visit today.  It is important that you have your platelet count rechecked, and other coagulation factors checked. Can use Afrin as needed for nosebleeds.  Apply pressure to the soft part of your nose for a few minutes to help stop bleeding.  Return to the ER if you are unable to control bleeding.

## 2018-04-11 NOTE — ED Triage Notes (Signed)
Pt states that she is on Kadcyla (chemo drug) and one side effect is nose bleeds. Pt states that she has had a low hgb with this medication before but has not had to have a transfusion. Pt has bleeding controlled with tissue in nose at this time. States that when she removes it the clot comes out and starts bleeding again. Attempted to use afrin at home with no relief.

## 2018-04-11 NOTE — ED Notes (Signed)
Pt informed RN that she needs to leave due for family reasons. EDP notified.

## 2018-04-11 NOTE — ED Provider Notes (Signed)
[place] EMERGENCY DEPARTMENT Provider Note   CSN: [id number] Arrival date & time: 04/11/18  0857     History   Chief Complaint Chief Complaint  Patient presents with  . Epistaxis    HPI Darlene Kline is a 37 y.o. female with past medical history of metastatic breast cancer, presenting to the ED with epistaxis that began at 9:30 PM last night.  Patient states it has been a "fast drip" which she has been intermittently able to stop with tissue in her nose, however when she removes the tissue it begins bleeding again.  She states she is recently started on a new chemotherapy medication, Kadcyla, which she was told can increase her risk of nosebleeds.  Denies trauma to the nose.  States she attempted to use Afrin without relief.  Not on anticoagulation.  Reports this is the second episode of epistaxis since starting this medication. Her oncologist is Dr. Humphrey Rolls with [facility]. Per chart review, pt started on Kadcyla on 02/23/18, and recently received 2nd treatment on 5/17.  The history is provided by the patient and medical records.    Past Medical History:  Diagnosis Date  . Breast cancer, left breast [facility]     Patient Active Problem List   Diagnosis Date Noted  . Breast cancer, right ([place]) 01/04/2018    Past Surgical History:  Procedure Laterality Date  . BREAST BIOPSY Left 07/2018  . CESAREAN SECTION  2013; 2017  . DILATION AND CURETTAGE OF UTERUS  2000  . MASTECTOMY Left 01/04/2018  . MASTECTOMY Right 01/04/2018   PROPHYLACTIC NIPPLE SPARING MASTECTOMY  . MASTECTOMY MODIFIED RADICAL Left 01/04/2018   Procedure: LEFT MASTECTOMY MODIFIED RADICAL;  Surgeon: Rolm Bookbinder, MD;  Location: [place];  Service: General;  Laterality: Left;  . NIPPLE SPARING MASTECTOMY Right 01/04/2018   Procedure: RIGHT PROPHYLACTIC NIPPLE SPARING MASTECTOMY;  Surgeon: Rolm Bookbinder, MD;  Location: [place];  Service: General;  Laterality: Right;  . PORTA CATH INSERTION  Right 2018  . RECONSTRUCTION BREAST IMMEDIATE / DELAYED W/ TISSUE EXPANDER Bilateral 01/04/2018    AND ALLODERM     OB History    Gravida  2   Para  2   Term  2   Preterm      AB      Living  2     SAB      TAB      Ectopic      Multiple      Live Births               Home Medications    Prior to Admission medications   Medication Sig Start Date End Date Taking? Authorizing Provider  ado-trastuzumab emtansine (KADCYLA) 100 MG SOLR Inject into the vein every 21 ( twenty-one) days.    [provider]  Emollient (RA ALPHA HYDROXY FACE CREAM EX) Apply 1 application topically at bedtime.    [provider]  ibuprofen (ADVIL,MOTRIN) 600 MG tablet Take 600 mg by mouth daily as needed for moderate pain.     [provider]  Javier Docker Oil 500 MG CAPS Take 500 mg by mouth daily.    [provider]  leuprolide (LUPRON) 11.25 MG injection Inject 11.25 mg into the muscle every 21 ( twenty-one) days.    [provider]  loratadine (CLARITIN) 10 MG tablet Take 10 mg by mouth daily as needed for allergies.    [provider]  methocarbamol (ROBAXIN) 500 MG tablet Take 1  tablet (500 mg total) by mouth every 8 (eight) hours as needed for muscle spasms. Patient not taking: Reported on 03/07/2018 01/05/18   Irene Limbo, MD  Multiple Vitamins-Minerals (WOMENS ONE DAILY) TABS Take 2 tablets by mouth daily.    [provider]  OVER THE COUNTER MEDICATION Take 2 tablets by mouth daily. Liver focus otc supplement    [provider]  OVER THE COUNTER MEDICATION Apply 1 application topically 3 (three) times a week. Retinol 0.5 lotion    [provider]  oxyCODONE (OXY IR/ROXICODONE) 5 MG immediate release tablet Take 1-2 tablets (5-10 mg total) by mouth every 4 (four) hours as needed for moderate pain. Patient not taking: Reported on 03/07/2018 01/05/18   Irene Limbo, MD  Probiotic CAPS Take 2 capsules by  mouth daily with supper.    [provider]  SPIRULINA PO Take 800 mg by mouth every other day. blue majik otc supplement    [provider]  sulfamethoxazole-trimethoprim (BACTRIM DS,SEPTRA DS) 800-160 MG tablet Take 1 tablet by mouth 2 (two) times daily. Patient not taking: Reported on 03/07/2018 01/05/18   Irene Limbo, MD  traZODone (DESYREL) 50 MG tablet Take 50 mg by mouth at bedtime as needed for sleep.    [provider]  TURMERIC PO Take 1 capsule by mouth daily.    [provider]  vitamin A 10000 UNIT capsule Take 10,000 Units by mouth daily.    [provider]    Family History No family history on file.  Social History Social History   Tobacco Use  . Smoking status: Never Smoker  . Smokeless tobacco: Never Used  Substance Use Topics  . Alcohol use: Not Currently    Comment: 01/04/2018 'might drink twice/year"  . Drug use: No     Allergies   Adhesive [tape]   Review of Systems Review of Systems  Constitutional: Negative for fever.  HENT: Positive for nosebleeds.   Allergic/Immunologic: Positive for immunocompromised state.  Hematological: Does not bruise/bleed easily.  All other systems reviewed and are negative.    Physical Exam Updated Vital Signs BP (!) 145/104   Pulse 65   Temp 98.5 F (36.9 C) (Oral)   Resp 16   Ht 5\' 3"  (1.6 m)   Wt 66.2 kg (146 lb)   SpO2 100%   BMI 25.86 kg/m   Physical Exam  Constitutional: She appears well-developed and well-nourished. No distress.  HENT:  Head: Normocephalic and atraumatic.  Left nare with rolled tissue.  Tissue gently removed with large clot.  Septum visualized without active bleeding.  No lesions to septum.  Oropharynx is clear.  Eyes: Conjunctivae are normal.  Neck: Normal range of motion. Neck supple.  Cardiovascular: Normal rate, regular rhythm and intact distal pulses.  Pulmonary/Chest: Effort normal and breath sounds normal.  Abdominal: Soft.    Neurological: She is alert.  Skin: Skin is warm.  Psychiatric: She has a normal mood and affect. Her behavior is normal.  Nursing note and vitals reviewed.    ED Treatments / Results  Labs (all labs ordered are listed, but only abnormal results are displayed) Labs Reviewed  COMPREHENSIVE METABOLIC PANEL - Abnormal; Notable for the following components:      Result Value   Glucose, Bld 103 (*)    BUN 23 (*)    All other components within normal limits  CBC WITH DIFFERENTIAL/PLATELET - Abnormal; Notable for the following components:   RBC 3.83 (*)    Hemoglobin 10.2 (*)  HCT 31.7 (*)    Platelets 93 (*)    All other components within normal limits    EKG None  Radiology No results found.  Procedures Procedures (including critical care time)  Medications Ordered in ED Medications  oxymetazoline (AFRIN) 0.05 % nasal spray 2 spray (2 sprays Left Nare Given 04/11/18 1249)     Initial Impression / Assessment and Plan / ED Course  I have reviewed the triage vital signs and the nursing notes.  Pertinent labs & imaging results that were available during my care of the patient were reviewed by me and considered in my medical decision making (see chart for details).  Clinical Course as of Apr 12 1435  Wed Apr 11, 2018  1334 Dr. Irene Limbo with oncology recommends pt does not require platelet transfusion at this time. Recommends control bleeding, coags, and follow up with her oncologist outpatient.    [JR]    Clinical Course User Index [JR] Robinson, [place] N, PA-C    Patient presenting to the ED with epistaxis.  On evaluation, bleeding has subsided.  Labs with stable hemoglobin however new decrease in platelet count from 261 three months ago to 93 today. Pt discussed with oncology, who recommends no drastic changes to medication regimen at this time. Recommends coagulation factors, and outpatient follow up with her oncologist. No drastic medications changes at this time, does  not recommend platelet transfusion. Recommendations discussed w patient, who stated she needed to leave to pick up her son.  Discussed recommendation for additional labs.  Patient states she has close follow-up with her oncologist.  Recommend recheck platelet count and coagulation factors, as well as discussion about medications.  Discussed symptomatic management for epistaxis at home and strict return precautions.  Safe for discharge at this time.  Patient discussed with Dr.Yao.  Discussed results, findings, treatment and follow up. Patient advised of return precautions. Patient verbalized understanding and agreed with plan.   Final Clinical Impressions(s) / ED Diagnoses   Final diagnoses:  Left-sided epistaxis  Thrombocytopenia [facility]    ED Discharge Orders    None       Robinson, [place] N, PA-C 04/11/18 1441    Drenda Freeze, MD 04/11/18 1944

## 2018-06-18 DIAGNOSIS — D649 Anemia, unspecified: Secondary | ICD-10-CM | POA: Insufficient documentation

## 2018-07-02 DIAGNOSIS — D509 Iron deficiency anemia, unspecified: Secondary | ICD-10-CM | POA: Insufficient documentation

## 2018-08-10 ENCOUNTER — Telehealth: Payer: Self-pay

## 2018-08-10 NOTE — Telephone Encounter (Signed)
New patient referral received from [facility], Dr. Rea College. P: [phone] F: [phone].   Notes sent to scheduling.

## 2018-08-13 NOTE — Pre-Procedure Instructions (Signed)
Darlene Kline  08/13/2018      [id number] - [place], [place] - [address] AT [address] [place] [address] Phone: [phone] Fax: [phone]    Your procedure is scheduled on Friday September 27.  Report to [facility] Admitting at 9:30 A.M.  Call this number if you have problems the morning of surgery:  [phone]   Remember:  Do not eat or drink after midnight.  You may drink clear liquids until 8:30 AM (3 hours prior to surgery) .  Clear liquids allowed are:  Water, Juice (non-citric and without pulp), Black Coffee only and Gatorade    Take these medicines the morning of surgery with A SIP OF WATER:   Loratadine (Claritin) if needed  7 days prior to surgery STOP taking any Aspirin(unless otherwise instructed by your surgeon), Aleve, Naproxen, Ibuprofen, Motrin, Advil, Goody's, BC's, all herbal medications, fish oil, and all vitamins      Do not wear jewelry, make-up or nail polish.  Do not wear lotions, powders, or perfumes, or deodorant.  Do not shave 48 hours prior to surgery.  Men may shave face and neck.  Do not bring valuables to the hospital.  [facility] is not responsible for any belongings or valuables.  Contacts, dentures or bridgework may not be worn into surgery.  Leave your suitcase in the car.  After surgery it may be brought to your room.  For patients admitted to the hospital, discharge time will be determined by your treatment team.  Patients discharged the day of surgery will not be allowed to drive home.   Special instructions:    [place]- Preparing For Surgery  Before surgery, you can play an important role. Because skin is not sterile, your skin needs to be as free of germs as possible. You can reduce the number of germs on your skin by washing with CHG (chlorahexidine gluconate) Soap before surgery.  CHG is an antiseptic cleaner which kills germs and bonds with  the skin to continue killing germs even after washing.    Oral Hygiene is also important to reduce your risk of infection.  Remember - BRUSH YOUR TEETH THE MORNING OF SURGERY WITH YOUR REGULAR TOOTHPASTE  Please do not use if you have an allergy to CHG or antibacterial soaps. If your skin becomes reddened/irritated stop using the CHG.  Do not shave (including legs and underarms) for at least 48 hours prior to first CHG shower. It is OK to shave your face.  Please follow these instructions carefully.   1. Shower the NIGHT BEFORE SURGERY and the MORNING OF SURGERY with CHG.   2. If you chose to wash your hair, wash your hair first as usual with your normal shampoo.  3. After you shampoo, rinse your hair and body thoroughly to remove the shampoo.  4. Use CHG as you would any other liquid soap. You can apply CHG directly to the skin and wash gently with a scrungie or a clean washcloth.   5. Apply the CHG Soap to your body ONLY FROM THE NECK DOWN.  Do not use on open wounds or open sores. Avoid contact with your eyes, ears, mouth and genitals (private parts). Wash Face and genitals (private parts)  with your normal soap.  6. Wash thoroughly, paying special attention to the area where your surgery will be performed.  7. Thoroughly rinse your body with warm water from the neck down.  8. DO NOT shower/wash with your normal soap after using and rinsing off the CHG Soap.  9. Pat yourself dry with a CLEAN TOWEL.  10. Wear CLEAN PAJAMAS to bed the night before surgery, wear comfortable clothes the morning of surgery  11. Place CLEAN SHEETS on your bed the night of your first shower and DO NOT SLEEP WITH PETS.    Day of Surgery:  Do not apply any deodorants/lotions.  Please wear clean clothes to the hospital/surgery center.   Remember to brush your teeth WITH YOUR REGULAR TOOTHPASTE.    Please read over the following fact sheets that you were given. Coughing and Deep Breathing and  Surgical Site Infection Prevention

## 2018-08-14 ENCOUNTER — Encounter (HOSPITAL_COMMUNITY)
Admission: RE | Admit: 2018-08-14 | Discharge: 2018-08-14 | Disposition: A | Discharge status: Home / Self Care | Payer: Managed Care, Other (non HMO) | Source: Ambulatory Visit | Attending: Plastic Surgery | Admitting: Plastic Surgery

## 2018-08-14 ENCOUNTER — Other Ambulatory Visit: Payer: Self-pay

## 2018-08-14 ENCOUNTER — Encounter (HOSPITAL_COMMUNITY): Payer: Self-pay

## 2018-08-14 ENCOUNTER — Other Ambulatory Visit (HOSPITAL_COMMUNITY): Payer: Managed Care, Other (non HMO)

## 2018-08-14 DIAGNOSIS — M96843 Postprocedural seroma of a musculoskeletal structure following other procedure: Secondary | ICD-10-CM | POA: Diagnosis not present

## 2018-08-14 DIAGNOSIS — Y813 Surgical instruments, materials and general- and plastic-surgery devices (including sutures) associated with adverse incidents: Secondary | ICD-10-CM | POA: Diagnosis not present

## 2018-08-14 DIAGNOSIS — Z853 Personal history of malignant neoplasm of breast: Secondary | ICD-10-CM | POA: Insufficient documentation

## 2018-08-14 DIAGNOSIS — Z79899 Other long term (current) drug therapy: Secondary | ICD-10-CM | POA: Insufficient documentation

## 2018-08-14 DIAGNOSIS — Z01812 Encounter for preprocedural laboratory examination: Secondary | ICD-10-CM | POA: Insufficient documentation

## 2018-08-14 DIAGNOSIS — D630 Anemia in neoplastic disease: Secondary | ICD-10-CM | POA: Diagnosis not present

## 2018-08-14 DIAGNOSIS — I10 Essential (primary) hypertension: Secondary | ICD-10-CM

## 2018-08-14 DIAGNOSIS — Z421 Encounter for breast reconstruction following mastectomy: Secondary | ICD-10-CM | POA: Diagnosis present

## 2018-08-14 DIAGNOSIS — Z9012 Acquired absence of left breast and nipple: Secondary | ICD-10-CM | POA: Insufficient documentation

## 2018-08-14 DIAGNOSIS — D649 Anemia, unspecified: Secondary | ICD-10-CM | POA: Insufficient documentation

## 2018-08-14 DIAGNOSIS — Z923 Personal history of irradiation: Secondary | ICD-10-CM | POA: Diagnosis not present

## 2018-08-14 DIAGNOSIS — Z9013 Acquired absence of bilateral breasts and nipples: Secondary | ICD-10-CM | POA: Diagnosis not present

## 2018-08-14 DIAGNOSIS — Z9221 Personal history of antineoplastic chemotherapy: Secondary | ICD-10-CM | POA: Diagnosis not present

## 2018-08-14 HISTORY — DX: Other secondary hypertension: I15.8

## 2018-08-14 HISTORY — DX: Anemia, unspecified: D64.9

## 2018-08-14 HISTORY — DX: Adverse effect of unspecified drugs, medicaments and biological substances, initial encounter: T50.905A

## 2018-08-14 LAB — CBC WITH DIFFERENTIAL/PLATELET
ABS IMMATURE GRANULOCYTES: 0 10*3/uL (ref 0.0–0.1)
Basophils Absolute: 0 10*3/uL (ref 0.0–0.1)
Basophils Relative: 1 %
EOS PCT: 2 %
Eosinophils Absolute: 0.1 10*3/uL (ref 0.0–0.7)
HEMATOCRIT: 31.8 % — AB (ref 36.0–46.0)
HEMOGLOBIN: 10.3 g/dL — AB (ref 12.0–15.0)
Immature Granulocytes: 0 %
LYMPHS ABS: 1.2 10*3/uL (ref 0.7–4.0)
LYMPHS PCT: 26 %
MCH: 29.1 pg (ref 26.0–34.0)
MCHC: 32.4 g/dL (ref 30.0–36.0)
MCV: 89.8 fL (ref 78.0–100.0)
MONO ABS: 0.3 10*3/uL (ref 0.1–1.0)
Monocytes Relative: 7 %
Neutro Abs: 2.9 10*3/uL (ref 1.7–7.7)
Neutrophils Relative %: 64 %
Platelets: 94 10*3/uL — ABNORMAL LOW (ref 150–400)
RBC: 3.54 MIL/uL — ABNORMAL LOW (ref 3.87–5.11)
RDW: 17.5 % — ABNORMAL HIGH (ref 11.5–15.5)
WBC: 4.5 10*3/uL (ref 4.0–10.5)

## 2018-08-14 LAB — BASIC METABOLIC PANEL
Anion gap: 11 (ref 5–15)
BUN: 6 mg/dL (ref 6–20)
CHLORIDE: 103 mmol/L (ref 98–111)
CO2: 24 mmol/L (ref 22–32)
Calcium: 9.1 mg/dL (ref 8.9–10.3)
Creatinine, Ser: 0.56 mg/dL (ref 0.44–1.00)
GFR calc Af Amer: >60 mL/min (ref >60)
GFR calc non Af Amer: >60 mL/min (ref >60)
GLUCOSE: 80 mg/dL (ref 70–99)
POTASSIUM: 3.8 mmol/L (ref 3.5–5.1)
Sodium: 138 mmol/L (ref 135–145)

## 2018-08-14 NOTE — Progress Notes (Signed)
PCP - Nelva Bush Cardiologist - denies cardiologist, but has routine TTE's done by Dr. Chancy Milroy. Pt states she is being sent to have  MUGA test on 08/18/18 at [place] in [facility] d/t having a "slow heart rate" per MD. Pt states she has not had any cardiac issues in the past.   Chest x-ray - N/A EKG - requested from [facility] - 08/03/18 Cardiac Cath - denies  Anesthesia review: follow up on EKG and MUGA?  Patient denies shortness of breath, fever, cough and chest pain at PAT appointment   Patient verbalized understanding of instructions that were given to them at the PAT appointment. Patient was also instructed that they will need to review over the PAT instructions again at home before surgery.

## 2018-08-14 NOTE — Progress Notes (Addendum)
Anesthesia Chart Review:  Case:  [id number] Date/Time:  08/17/18 1115   Procedures:      REMOVAL OF BILATERAL TISSUE EXPANDERS WITH PLACEMENT OF BILATERAL SILICONE BREAST IMPLANTS (Bilateral Breast)     LEFT LATISSIMUS FLAP (Left Chest)   Anesthesia type:  General   Pre-op diagnosis:  acquired absence breasts history breast cancer history therapeutic radiation   Location:  [facility] OR ROOM 11 / [place] OR   Surgeon:  Irene Limbo, MD      DISCUSSION: 37 yo female never smoker. Pertinent hx includes HTN, Anemia, Breast CA s/p Right NSM, left total mastectomy, left ALND, bilateral prepectoral TE/ADM reconstruction now on chemotherapy.  Pt with serial echos to monitor cardiac function. Most recent echo 08/03/2018 showed EF 40-45% with mild-mod global hypokinesis with mild-mod MR. This is significant change from prior echo 04/23/2018 whish showed EF 55-60% and normal wall motion also with mild-mod MR. Chemotherapy held 08/10/18 due to recent echocardiogram results.   Patient scheduled to have a MUGA scan on 08/16/18 at [facility], results of this test not available as of end of day 9/26.   Per Dr. Para Skeans notes the surgery needs to be done somewhat expediently due to high risk for tissue ulceration  Discussed case with Dr. Suann Larry. He advised that she will need DOS eval by anesthesiologist, but barring acute status change the pt can proceed with surgery as planned.   ADDENDUM 08/17/18: MUGA scan results now avialable in care everywhere:  NUCLEAR MEDICINE CARDIAC BLOOD POOL IMAGING (MUGA) 08/17/2018  TECHNIQUE: Cardiac multi-gated acquisition was performed at rest following intravenous injection of Tc-35m labeled red blood cells.  RADIOPHARMACEUTICALS: 15.5 mCi Tc-57m pertechnetate in-vitro labeled red blood cells IV  COMPARISON: None available  FINDINGS: Limited exam because of overlying tissue expanders. Resting left ventricular ejection fraction is decreased  measuring approximately 40%.  IMPRESSION: Resting LV ejection fraction estimated at 40%. Limited exam because of overlying breast tissue expanders.  VS: BP 136/85   Pulse 84   Temp 36.7 C   Resp 18   Ht 5\' 3"  (1.6 m)   Wt 59.7 kg   BMI 23.33 kg/m   PROVIDERS: Clydie Braun, MD is PCP  Marcy Panning, MD is Oncologist  LABS: Labs reviewed: Acceptable for surgery. Mild pancytopenia likely related to chemo (all labs ordered are listed, but only abnormal results are displayed)  Labs Reviewed  CBC WITH DIFFERENTIAL/PLATELET - Abnormal; Notable for the following components:      Result Value   RBC 3.54 (*)    Hemoglobin 10.3 (*)    HCT 31.8 (*)    RDW 17.5 (*)    Platelets 94 (*)    All other components within normal limits  BASIC METABOLIC PANEL     IMAGES: CHEST2 VIEW 09/22/2017  COMPARISON:Chest CT dated 08/07/2017  FINDINGS: Right pectoral Port-A-Cath with tip at the cavoatrial junction. The lungs are clear. There is no pleural effusion or pneumothorax. The cardiac silhouette is within normal limits. No acute osseous pathology.  IMPRESSION: No active cardiopulmonary disease.  Right-sided Port-A-Cath with tip at cavoatrial junction.   EKG: Will need DOS   CV: TTE 08/03/2018 (care everywhere): Technical Quality: Limited visualization Study Location: Portable Indications: Post chemotherapy. Patient Status: Routine Height: 63 inches Weight: 129 pounds BSA: 1.61 m2 BMI: 22.85 kg/m2 BP: 115/60 mmHg Conclusions Summary Technically difficult and limited study due to tissue expanders. Left ventricle cavity size is normal. Mildly to moderately reduced LV systolic function with mild to  moderate global hypokinesis. LVEF estimated at 40-45%  TTE 04/23/2018 (care everywhere): Technical Quality: Adequate visualization Study Location: Echo Lab Indications: Post chemotherapy. Patient Status: Routine Height: 63 inches Weight: 146 pounds BSA: 1.69 m2 BMI: 25.86  kg/m2 BP: 147/92 mmHg Conclusions Summary Normal left ventricular size and systolic function with no appreciable segmental abnormality. Ejection fraction is visually estimated at 55-60% Non-specific mitral leaflet thickening with preserved mobility. Mild to Moderate mitral regurgitation. Normal right ventricle structure and function.  Past Medical History:  Diagnosis Date  . Anemia    off and on d/t chemo  . Breast cancer, left breast ([place])   . 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 BREAST BIOPSY Left 07/2018  . CESAREAN SECTION  2013; 2017  . DILATION AND CURETTAGE OF UTERUS  2000  . MASTECTOMY Left 01/04/2018  . MASTECTOMY Right 01/04/2018   PROPHYLACTIC NIPPLE SPARING MASTECTOMY  . MASTECTOMY MODIFIED RADICAL Left 01/04/2018   Procedure: LEFT MASTECTOMY MODIFIED RADICAL;  Surgeon: Rolm Bookbinder, MD;  Location: [place];  Service: General;  Laterality: Left;  . NIPPLE SPARING MASTECTOMY Right 01/04/2018   Procedure: RIGHT PROPHYLACTIC NIPPLE SPARING MASTECTOMY;  Surgeon: Rolm Bookbinder, MD;  Location: [place];  Service: General;  Laterality: Right;  . PORTA CATH INSERTION Right 2018  . RECONSTRUCTION BREAST IMMEDIATE / DELAYED W/ TISSUE EXPANDER Bilateral 01/04/2018    AND ALLODERM    MEDICATIONS: . ado-trastuzumab emtansine (KADCYLA) 100 MG SOLR  . anastrozole (ARIMIDEX) 1 MG tablet  . leuprolide (LUPRON) 11.25 MG injection  . Multiple Vitamins-Minerals (WOMENS ONE DAILY) TABS  . OVER THE COUNTER MEDICATION  . oxyCODONE (OXY IR/ROXICODONE) 5 MG immediate release tablet  . traZODone (DESYREL) 50 MG tablet   No current facility-administered medications for this encounter.     Wynonia Musty [facility] Short Stay Center/Anesthesiology Phone [phone] 08/14/2018 2:02 PM

## 2018-08-17 ENCOUNTER — Ambulatory Visit (HOSPITAL_COMMUNITY): Payer: Managed Care, Other (non HMO) | Admitting: Physician Assistant

## 2018-08-17 ENCOUNTER — Observation Stay (HOSPITAL_COMMUNITY)
Admission: RE | Admit: 2018-08-17 | Discharge: 2018-08-18 | Disposition: A | Discharge status: Home / Self Care | Payer: Managed Care, Other (non HMO) | Source: Ambulatory Visit | Attending: Plastic Surgery | Admitting: Plastic Surgery

## 2018-08-17 ENCOUNTER — Ambulatory Visit (HOSPITAL_COMMUNITY): Payer: Managed Care, Other (non HMO)

## 2018-08-17 ENCOUNTER — Encounter (HOSPITAL_COMMUNITY)
Admission: RE | Disposition: A | Discharge status: Home / Self Care | Payer: Self-pay | Source: Ambulatory Visit | Attending: Plastic Surgery

## 2018-08-17 ENCOUNTER — Encounter (HOSPITAL_COMMUNITY): Payer: Self-pay | Admitting: *Deleted

## 2018-08-17 DIAGNOSIS — D630 Anemia in neoplastic disease: Secondary | ICD-10-CM | POA: Insufficient documentation

## 2018-08-17 DIAGNOSIS — Z421 Encounter for breast reconstruction following mastectomy: Principal | ICD-10-CM | POA: Insufficient documentation

## 2018-08-17 DIAGNOSIS — Y813 Surgical instruments, materials and general- and plastic-surgery devices (including sutures) associated with adverse incidents: Secondary | ICD-10-CM | POA: Insufficient documentation

## 2018-08-17 DIAGNOSIS — Z853 Personal history of malignant neoplasm of breast: Secondary | ICD-10-CM | POA: Insufficient documentation

## 2018-08-17 DIAGNOSIS — Z9013 Acquired absence of bilateral breasts and nipples: Secondary | ICD-10-CM | POA: Insufficient documentation

## 2018-08-17 DIAGNOSIS — Z9221 Personal history of antineoplastic chemotherapy: Secondary | ICD-10-CM | POA: Insufficient documentation

## 2018-08-17 DIAGNOSIS — M96843 Postprocedural seroma of a musculoskeletal structure following other procedure: Secondary | ICD-10-CM | POA: Insufficient documentation

## 2018-08-17 DIAGNOSIS — Z923 Personal history of irradiation: Secondary | ICD-10-CM | POA: Insufficient documentation

## 2018-08-17 DIAGNOSIS — I10 Essential (primary) hypertension: Secondary | ICD-10-CM | POA: Insufficient documentation

## 2018-08-17 HISTORY — PX: LATISSIMUS FLAP TO BREAST: SHX5357

## 2018-08-17 HISTORY — PX: REMOVAL OF BILATERAL TISSUE EXPANDERS WITH PLACEMENT OF BILATERAL BREAST IMPLANTS: SHX6431

## 2018-08-17 SURGERY — REMOVAL, TISSUE EXPANDER, BREAST, BILATERAL, WITH BILATERAL IMPLANT IMPLANT INSERTION
Anesthesia: General | Site: Chest | Laterality: Left

## 2018-08-17 MED ORDER — BUPIVACAINE LIPOSOME 1.3 % IJ SUSP
INTRAMUSCULAR | Status: DC | PRN
  Administered 2018-08-17: 20 mL

## 2018-08-17 MED ORDER — ROCURONIUM BROMIDE 50 MG/5ML IV SOSY
PREFILLED_SYRINGE | INTRAVENOUS | Status: AC
  Filled 2018-08-17: qty 5

## 2018-08-17 MED ORDER — CHLORHEXIDINE GLUCONATE CLOTH 2 % EX PADS: 6.0000 | MEDICATED_PAD | Freq: Once | CUTANEOUS | Status: DC

## 2018-08-17 MED ORDER — FENTANYL CITRATE (PF) 250 MCG/5ML IJ SOLN
INTRAMUSCULAR | Status: AC
  Filled 2018-08-17: qty 5

## 2018-08-17 MED ORDER — LIDOCAINE 2% (20 MG/ML) 5 ML SYRINGE
INTRAMUSCULAR | Status: DC | PRN
  Administered 2018-08-17: 60 mg via INTRAVENOUS

## 2018-08-17 MED ORDER — GABAPENTIN 300 MG PO CAPS
300.0000 mg | ORAL_CAPSULE | ORAL | Status: AC
  Administered 2018-08-17: 300 mg via ORAL
  Filled 2018-08-17: qty 1

## 2018-08-17 MED ORDER — PHENYLEPHRINE HCL 10 MG/ML IJ SOLN
INTRAMUSCULAR | Status: AC
  Filled 2018-08-17: qty 3

## 2018-08-17 MED ORDER — HEPARIN SODIUM (PORCINE) 5000 UNIT/ML IJ SOLN
5000.0000 [IU] | Freq: Once | INTRAMUSCULAR | Status: AC
  Administered 2018-08-17: 5000 [IU] via SUBCUTANEOUS
  Filled 2018-08-17: qty 1

## 2018-08-17 MED ORDER — ONDANSETRON 4 MG PO TBDP: 4.0000 mg | ORAL_TABLET | Freq: Four times a day (QID) | ORAL | Status: DC | PRN

## 2018-08-17 MED ORDER — SODIUM CHLORIDE 0.9 % IV SOLN
Freq: Once | INTRAVENOUS | Status: DC
  Filled 2018-08-17: qty 1

## 2018-08-17 MED ORDER — ONDANSETRON HCL 4 MG/2ML IJ SOLN
INTRAMUSCULAR | Status: AC
  Filled 2018-08-17: qty 2

## 2018-08-17 MED ORDER — PROMETHAZINE HCL 25 MG/ML IJ SOLN: 6.2500 mg | INTRAMUSCULAR | Status: DC | PRN

## 2018-08-17 MED ORDER — ROCURONIUM BROMIDE 100 MG/10ML IV SOLN
INTRAVENOUS | Status: DC | PRN
  Administered 2018-08-17: 20 mg via INTRAVENOUS
  Administered 2018-08-17: 50 mg via INTRAVENOUS
  Administered 2018-08-17: 10 mg via INTRAVENOUS

## 2018-08-17 MED ORDER — GABAPENTIN 300 MG PO CAPS
300.0000 mg | ORAL_CAPSULE | Freq: Two times a day (BID) | ORAL | Status: DC
  Administered 2018-08-17 – 2018-08-18 (×2): 300 mg via ORAL
  Filled 2018-08-17 (×2): qty 1

## 2018-08-17 MED ORDER — KCL IN DEXTROSE-NACL 20-5-0.45 MEQ/L-%-% IV SOLN
INTRAVENOUS | Status: DC
  Administered 2018-08-17: 75 mL via INTRAVENOUS
  Administered 2018-08-18: 10:00:00 via INTRAVENOUS
  Filled 2018-08-17 (×2): qty 1000

## 2018-08-17 MED ORDER — SUGAMMADEX SODIUM 200 MG/2ML IV SOLN
INTRAVENOUS | Status: DC | PRN
  Administered 2018-08-17: 100 mg via INTRAVENOUS

## 2018-08-17 MED ORDER — FENTANYL CITRATE (PF) 100 MCG/2ML IJ SOLN
INTRAMUSCULAR | Status: DC | PRN
  Administered 2018-08-17 (×8): 50 ug via INTRAVENOUS

## 2018-08-17 MED ORDER — DEXAMETHASONE SODIUM PHOSPHATE 10 MG/ML IJ SOLN
INTRAMUSCULAR | Status: DC | PRN
  Administered 2018-08-17: 10 mg via INTRAVENOUS

## 2018-08-17 MED ORDER — MIDAZOLAM HCL 2 MG/2ML IJ SOLN
INTRAMUSCULAR | Status: AC
  Filled 2018-08-17: qty 2

## 2018-08-17 MED ORDER — PROPOFOL 10 MG/ML IV BOLUS
INTRAVENOUS | Status: DC | PRN
  Administered 2018-08-17: 150 mg via INTRAVENOUS

## 2018-08-17 MED ORDER — OXYCODONE HCL 5 MG PO TABS: 5.0000 mg | ORAL_TABLET | ORAL | Status: DC | PRN

## 2018-08-17 MED ORDER — CELECOXIB 200 MG PO CAPS
200.0000 mg | ORAL_CAPSULE | ORAL | Status: AC
  Administered 2018-08-17: 200 mg via ORAL
  Filled 2018-08-17: qty 1

## 2018-08-17 MED ORDER — BUPIVACAINE LIPOSOME 1.3 % IJ SUSP
20.0000 mL | Freq: Once | INTRAMUSCULAR | Status: DC
  Filled 2018-08-17: qty 20

## 2018-08-17 MED ORDER — HYDROMORPHONE HCL 1 MG/ML IJ SOLN
0.2500 mg | INTRAMUSCULAR | Status: DC | PRN
  Administered 2018-08-17 (×3): 0.25 mg via INTRAVENOUS

## 2018-08-17 MED ORDER — CEFAZOLIN SODIUM-DEXTROSE 1-4 GM/50ML-% IV SOLN
1.0000 g | Freq: Three times a day (TID) | INTRAVENOUS | Status: DC
  Administered 2018-08-17 – 2018-08-18 (×2): 1 g via INTRAVENOUS
  Filled 2018-08-17 (×5): qty 50

## 2018-08-17 MED ORDER — KETOROLAC TROMETHAMINE 30 MG/ML IJ SOLN
30.0000 mg | Freq: Three times a day (TID) | INTRAMUSCULAR | Status: DC
  Administered 2018-08-17 – 2018-08-18 (×2): 30 mg via INTRAVENOUS
  Filled 2018-08-17 (×3): qty 1

## 2018-08-17 MED ORDER — MIDAZOLAM HCL 5 MG/5ML IJ SOLN
INTRAMUSCULAR | Status: DC | PRN
  Administered 2018-08-17: 2 mg via INTRAVENOUS

## 2018-08-17 MED ORDER — OXYCODONE HCL 5 MG/5ML PO SOLN: 5.0000 mg | Freq: Once | ORAL | Status: DC | PRN

## 2018-08-17 MED ORDER — HYDROMORPHONE HCL 1 MG/ML IJ SOLN: 0.5000 mg | INTRAMUSCULAR | Status: DC | PRN

## 2018-08-17 MED ORDER — LIDOCAINE 2% (20 MG/ML) 5 ML SYRINGE
INTRAMUSCULAR | Status: AC
  Filled 2018-08-17: qty 5

## 2018-08-17 MED ORDER — SODIUM CHLORIDE 0.9 % IV SOLN
INTRAVENOUS | Status: DC | PRN
  Administered 2018-08-17: 500 mL

## 2018-08-17 MED ORDER — LACTATED RINGERS IV SOLN
INTRAVENOUS | Status: DC
  Administered 2018-08-17 (×3): via INTRAVENOUS

## 2018-08-17 MED ORDER — ACETAMINOPHEN 500 MG PO TABS
1000.0000 mg | ORAL_TABLET | ORAL | Status: AC
  Administered 2018-08-17: 1000 mg via ORAL
  Filled 2018-08-17: qty 2

## 2018-08-17 MED ORDER — 0.9 % SODIUM CHLORIDE (POUR BTL) OPTIME
TOPICAL | Status: DC | PRN
  Administered 2018-08-17: 3000 mL

## 2018-08-17 MED ORDER — DEXAMETHASONE SODIUM PHOSPHATE 10 MG/ML IJ SOLN
INTRAMUSCULAR | Status: AC
  Filled 2018-08-17: qty 1

## 2018-08-17 MED ORDER — ONDANSETRON HCL 4 MG/2ML IJ SOLN
4.0000 mg | Freq: Four times a day (QID) | INTRAMUSCULAR | Status: DC | PRN
  Administered 2018-08-17: 4 mg via INTRAVENOUS
  Filled 2018-08-17: qty 2

## 2018-08-17 MED ORDER — PROMETHAZINE HCL 25 MG/ML IJ SOLN
12.5000 mg | Freq: Four times a day (QID) | INTRAMUSCULAR | Status: DC | PRN
  Administered 2018-08-17: 12.5 mg via INTRAVENOUS
  Filled 2018-08-17: qty 1

## 2018-08-17 MED ORDER — KETOROLAC TROMETHAMINE 30 MG/ML IJ SOLN: 30.0000 mg | Freq: Once | INTRAMUSCULAR | Status: DC | PRN

## 2018-08-17 MED ORDER — ONDANSETRON HCL 4 MG/2ML IJ SOLN
INTRAMUSCULAR | Status: DC | PRN
  Administered 2018-08-17 (×2): 4 mg via INTRAVENOUS

## 2018-08-17 MED ORDER — SODIUM CHLORIDE 0.9 % IV SOLN
INTRAVENOUS | Status: DC | PRN
  Administered 2018-08-17: 20 ug/min via INTRAVENOUS

## 2018-08-17 MED ORDER — CEFAZOLIN SODIUM-DEXTROSE 2-4 GM/100ML-% IV SOLN
2.0000 g | INTRAVENOUS | Status: AC
  Administered 2018-08-17 (×2): 2 g via INTRAVENOUS
  Filled 2018-08-17: qty 100

## 2018-08-17 MED ORDER — ENOXAPARIN SODIUM 40 MG/0.4ML ~~LOC~~ SOLN
40.0000 mg | SUBCUTANEOUS | Status: DC
  Administered 2018-08-18: 40 mg via SUBCUTANEOUS
  Filled 2018-08-17: qty 0.4

## 2018-08-17 MED ORDER — METHOCARBAMOL 500 MG PO TABS: 500.0000 mg | ORAL_TABLET | Freq: Four times a day (QID) | ORAL | Status: DC | PRN

## 2018-08-17 MED ORDER — HYDROMORPHONE HCL 1 MG/ML IJ SOLN
INTRAMUSCULAR | Status: AC
  Filled 2018-08-17: qty 1

## 2018-08-17 MED ORDER — OXYCODONE HCL 5 MG PO TABS: 5.0000 mg | ORAL_TABLET | Freq: Once | ORAL | Status: DC | PRN

## 2018-08-17 MED ORDER — PROPOFOL 10 MG/ML IV BOLUS
INTRAVENOUS | Status: AC
  Filled 2018-08-17: qty 20

## 2018-08-17 SURGICAL SUPPLY — 92 items
APPLIER CLIP 9.375 MED OPEN (MISCELLANEOUS) ×4
BAG DECANTER FOR FLEXI CONT (MISCELLANEOUS) IMPLANT
BINDER BREAST LRG (GAUZE/BANDAGES/DRESSINGS) ×4 IMPLANT
BLADE 10 SAFETY STRL DISP (BLADE) ×4 IMPLANT
BLADE SURG 10 STRL SS (BLADE) ×4 IMPLANT
BLADE SURG 15 STRL LF DISP TIS (BLADE) ×2 IMPLANT
BLADE SURG 15 STRL SS (BLADE) ×2
BNDG COHESIVE 4X5 TAN STRL (GAUZE/BANDAGES/DRESSINGS) IMPLANT
CANISTER SUCT 3000ML PPV (MISCELLANEOUS) ×4 IMPLANT
CHLORAPREP W/TINT 26ML (MISCELLANEOUS) ×4 IMPLANT
CLIP APPLIE 9.375 MED OPEN (MISCELLANEOUS) ×2 IMPLANT
CLIP VESOCCLUDE MED 6/CT (CLIP) ×4 IMPLANT
CLIP VESOCCLUDE SM WIDE 6/CT (CLIP) ×4 IMPLANT
CLOSURE WOUND 1/2 X4 (GAUZE/BANDAGES/DRESSINGS) ×2
CONT SPEC 4OZ CLIKSEAL STRL BL (MISCELLANEOUS) ×4 IMPLANT
COVER MAYO STAND STRL (DRAPES) IMPLANT
COVER SURGICAL LIGHT HANDLE (MISCELLANEOUS) ×4 IMPLANT
DERMABOND ADVANCED (GAUZE/BANDAGES/DRESSINGS) ×8
DERMABOND ADVANCED .7 DNX12 (GAUZE/BANDAGES/DRESSINGS) ×8 IMPLANT
DRAIN CHANNEL 15F RND FF W/TCR (WOUND CARE) IMPLANT
DRAIN CHANNEL 19F RND (DRAIN) ×16 IMPLANT
DRAPE HALF SHEET 40X57 (DRAPES) ×16 IMPLANT
DRAPE INCISE 23X17 IOBAN STRL (DRAPES)
DRAPE INCISE IOBAN 23X17 STRL (DRAPES) IMPLANT
DRAPE INCISE IOBAN 85X60 (DRAPES) ×4 IMPLANT
DRAPE ORTHO SPLIT 77X108 STRL (DRAPES) ×8
DRAPE SURG ORHT 6 SPLT 77X108 (DRAPES) ×8 IMPLANT
DRAPE WARM FLUID 44X44 (DRAPE) ×4 IMPLANT
DRSG MEPILEX BORDER 4X8 (GAUZE/BANDAGES/DRESSINGS) IMPLANT
DRSG PAD ABDOMINAL 8X10 ST (GAUZE/BANDAGES/DRESSINGS) ×12 IMPLANT
ELECT BLADE 4.0 EZ CLEAN MEGAD (MISCELLANEOUS) ×4
ELECT BLADE 6.5 EXT (BLADE) ×4 IMPLANT
ELECT COATED BLADE 2.86 ST (ELECTRODE) ×12 IMPLANT
ELECT REM PT RETURN 9FT ADLT (ELECTROSURGICAL) ×4
ELECTRODE BLDE 4.0 EZ CLN MEGD (MISCELLANEOUS) ×2 IMPLANT
ELECTRODE REM PT RTRN 9FT ADLT (ELECTROSURGICAL) ×2 IMPLANT
EVACUATOR SILICONE 100CC (DRAIN) ×8 IMPLANT
GAUZE SPONGE 4X4 12PLY STRL LF (GAUZE/BANDAGES/DRESSINGS) ×8 IMPLANT
GAUZE XEROFORM 5X9 LF (GAUZE/BANDAGES/DRESSINGS) IMPLANT
GLOVE BIO SURGEON STRL SZ 6 (GLOVE) ×12 IMPLANT
GLOVE ECLIPSE 7.5 STRL STRAW (GLOVE) ×8 IMPLANT
GLOVE SURG SS PI 6.5 STRL IVOR (GLOVE) ×8 IMPLANT
GOWN STRL REUS W/ TWL LRG LVL3 (GOWN DISPOSABLE) ×6 IMPLANT
GOWN STRL REUS W/TWL LRG LVL3 (GOWN DISPOSABLE) ×6
IMPL BREAST P5.8: XFULL 400 (Breast) ×2 IMPLANT
IMPL BREAST SILICONE 470CC (Breast) ×2 IMPLANT
IMPL BRST P5.8: XFULL 400CC (Breast) ×2 IMPLANT
IMPLANT BREAST GEL 400CC (Breast) ×2 IMPLANT
IMPLANT BREAST SILICONE 470CC (Breast) ×4 IMPLANT
KIT BASIN OR (CUSTOM PROCEDURE TRAY) ×4 IMPLANT
NEEDLE 22X1 1/2 (OR ONLY) (NEEDLE) ×4 IMPLANT
NS IRRIG 1000ML POUR BTL (IV SOLUTION) ×8 IMPLANT
PACK GENERAL/GYN (CUSTOM PROCEDURE TRAY) ×4 IMPLANT
PAD ABD 8X10 STRL (GAUZE/BANDAGES/DRESSINGS) ×8 IMPLANT
PAD ARMBOARD 7.5X6 YLW CONV (MISCELLANEOUS) ×12 IMPLANT
PEN SKIN MARKING BROAD (MISCELLANEOUS) ×4 IMPLANT
PENCIL BUTTON HOLSTER BLD 10FT (ELECTRODE) IMPLANT
SET COLLECT BLD 21X3/4 12 PB (MISCELLANEOUS) IMPLANT
SIZER BREAST INSPI REUSE 445CC (SIZER)
SIZER BREAST REUSE 400CC (SIZER) ×2
SIZER BREAST REUSE 420CC (SIZER) ×4
SIZER BREAST REUSE 470CC (SIZER) ×4
SIZER BRST INSPI REUSE 445CC (SIZER) IMPLANT
SIZER BRST REUSE 420CC (SIZER) ×2 IMPLANT
SIZER BRST REUSE 470CC (SIZER) ×2 IMPLANT
SIZER BRST REUSE P5.8X 400CC (SIZER) ×2 IMPLANT
SPONGE LAP 18X18 X RAY DECT (DISPOSABLE) ×8 IMPLANT
STAPLER VISISTAT 35W (STAPLE) ×4 IMPLANT
STOCKINETTE IMPERVIOUS 9X36 MD (GAUZE/BANDAGES/DRESSINGS) IMPLANT
STRIP CLOSURE SKIN 1/2X4 (GAUZE/BANDAGES/DRESSINGS) ×6 IMPLANT
SUT ETHILON 2 0 FS 18 (SUTURE) ×8 IMPLANT
SUT MNCRL AB 3-0 PS2 18 (SUTURE) ×8 IMPLANT
SUT MNCRL AB 4-0 PS2 18 (SUTURE) ×16 IMPLANT
SUT PDS AB 2-0 CT1 27 (SUTURE) ×4 IMPLANT
SUT PDS AB 2-0 CT2 27 (SUTURE) ×28 IMPLANT
SUT VIC AB 3-0 PS2 18 (SUTURE) ×8
SUT VIC AB 3-0 PS2 18XBRD (SUTURE) ×8 IMPLANT
SUT VIC AB 3-0 SH 27 (SUTURE) ×2
SUT VIC AB 3-0 SH 27X BRD (SUTURE) ×2 IMPLANT
SUT VIC AB 3-0 SH 8-18 (SUTURE) ×4 IMPLANT
SUT VIC AB 4-0 PS2 18 (SUTURE) ×8 IMPLANT
SUT VICRYL 4-0 PS2 18IN ABS (SUTURE) IMPLANT
SUT VLOC 180 0 24IN GS25 (SUTURE) ×4 IMPLANT
SYR 50ML SLIP (SYRINGE) IMPLANT
SYR BULB IRRIGATION 50ML (SYRINGE) ×16 IMPLANT
SYR CONTROL 10ML LL (SYRINGE) ×4 IMPLANT
TOWEL OR 17X24 6PK STRL BLUE (TOWEL DISPOSABLE) ×4 IMPLANT
TOWEL OR 17X26 10 PK STRL BLUE (TOWEL DISPOSABLE) ×4 IMPLANT
TRAY FOLEY MTR SLVR 14FR STAT (SET/KITS/TRAYS/PACK) ×4 IMPLANT
TUBE CONNECTING 12'X1/4 (SUCTIONS) ×1
TUBE CONNECTING 12X1/4 (SUCTIONS) ×3 IMPLANT
TUBING BULK SUCTION (MISCELLANEOUS) ×4 IMPLANT

## 2018-08-17 NOTE — Anesthesia Preprocedure Evaluation (Signed)
Anesthesia Evaluation  Patient identified by MRN, date of birth, ID band Patient awake    Reviewed: Allergy & Precautions, NPO status , Patient's Chart, lab work & pertinent test results  Airway Mallampati: II  TM Distance: >3 FB Neck ROM: Full    Dental no notable dental hx.    Pulmonary neg pulmonary ROS,    Pulmonary exam normal breath sounds clear to auscultation       Cardiovascular hypertension, Normal cardiovascular exam Rhythm:Regular Rate:Normal     Neuro/Psych negative neurological ROS  negative psych ROS   GI/Hepatic negative GI ROS, Neg liver ROS,   Endo/Other  negative endocrine ROS  Renal/GU negative Renal ROS  negative genitourinary   Musculoskeletal negative musculoskeletal ROS (+)   Abdominal   Peds negative pediatric ROS (+)  Hematology  (+) anemia ,   Anesthesia Other Findings   Reproductive/Obstetrics negative OB ROS                             Anesthesia Physical Anesthesia Plan  ASA: II  Anesthesia Plan: General   Post-op Pain Management:    Induction: Intravenous  PONV Risk Score and Plan: 3 and Ondansetron, Dexamethasone, Scopolamine patch - Pre-op, Midazolam and Treatment may vary due to age or medical condition  Airway Management Planned: LMA and Oral ETT  Additional Equipment:   Intra-op Plan:   Post-operative Plan: Extubation in OR  Informed Consent: I have reviewed the patients History and Physical, chart, labs and discussed the procedure including the risks, benefits and alternatives for the proposed anesthesia with the patient or authorized representative who has indicated his/her understanding and acceptance.   Dental advisory given  Plan Discussed with: CRNA and Surgeon  Anesthesia Plan Comments:         Anesthesia Quick Evaluation

## 2018-08-17 NOTE — Anesthesia Procedure Notes (Signed)
Procedure Name: Intubation Date/Time: 08/17/2018 11:44 AM Performed by: Gwyndolyn Saxon, CRNA Pre-anesthesia Checklist: Patient identified, Emergency Drugs available, Suction available and Patient being monitored Patient Re-evaluated:Patient Re-evaluated prior to induction Oxygen Delivery Method: Circle system utilized Preoxygenation: Pre-oxygenation with 100% oxygen Induction Type: IV induction Ventilation: Mask ventilation without difficulty Laryngoscope Size: Miller and 2 Grade View: Grade I Tube type: Oral Tube size: 7.0 mm Number of attempts: 1 Placement Confirmation: ETT inserted through vocal cords under direct vision,  positive ETCO2 and breath sounds checked- equal and bilateral Secured at: 20 cm Tube secured with: Tape Dental Injury: Teeth and Oropharynx as per pre-operative assessment

## 2018-08-17 NOTE — Op Note (Signed)
Operative Note   DATE OF OPERATION: 9.27.19  LOCATION: [facility]  SURGICAL DIVISION: Plastic Surgery  PREOPERATIVE DIAGNOSES:  1. History breast cancer 2. Acquired absence breasts 3. History therapeutic radiation left chest 4. Threatened exposure tissue expander left chest 5. Seroma left chest  POSTOPERATIVE DIAGNOSES:  same  PROCEDURE:  1. Removal bilateral tissue expanders and placement silicone implants 2. Left latissimus dorsi flap to left chest.  SURGEON: Irene Limbo MD MBA  ASSISTANT: [place] RNFA  ANESTHESIA:  General.   EBL: 332 ml  COMPLICATIONS: None immediate.   INDICATIONS FOR PROCEDURE:  The patient, Darlene Kline, is a 37 y.o. female born on 1981/08/30, is here for breast reconstruction. She underwent iight NSM, left total mastectomy, and immediate bilateral prepectoral TE/ADM reconstruction. She received adjuvant radiation to left chest. Approximately 4 weeks ago she experienced onset fluid collection left chest and this has recurred post repeat aspiration. This has lead to area of ulceration of left mastectomy flap with threatened exposure expander. Plan latissimus flap to try to salvage reconstruction.   FINDINGS: Natrelle Smooth Round Extra Projection implants placed bilateral. RIGHT 470 ml, REF SRX-470 SN [id number] LEFT 400 ml REF SRX-400 SN [id number]  DESCRIPTION OF PROCEDURE:  The patient's operative site was marked with the patient in the preoperative area including sternal notch, chest midline, anterior axillary lines.The patient was taken to the operating room. SCDs were placed and IV antibiotics were given. Foley catheter placed. Patient was placed inrightlateral position and prepped and draped. A time out was performed and all information was confirmed to be correct. Incision made in left inframammary fold and carried through superficial fascia to acellular dermis. This was incised and approximately 50 ml watery hemorrhagic  fluid obtained. This was sent for cytology.The expander was removed. Full incorporation of ADM noted; thick fibrotic capsule noted. Circumferential capsulotomies noted. Sharp excision with medial left chest eschar completed. Following this full thickness wound of 1 cm noted.   Incision made surrounding skin paddle designed over leftback. Skin and superficial fascia elevated off surface of latissimus muscle and subcutaneous tunnel dissected to axilla. Incision made in left axilla to aid with dissection and delivery of flap. Anterior border of latissimus identified and elevated. Muscle divided inferiorly at superior iliac spine. Submuscular dissection completed toward midline back and toward origin. Thoracodorsal nerve wasidentified anddivided. Flap rotated into anterior chest cavity. Back irrigated and hemostasis ensured. Exparel infiltrated total 266 mg forbilateralchest and back. 15Fr drain placed and secured with 2-0 nylon. 2-0 PDS used to placed quilting sutures from elevated skin flaps to chest wall. Incision closed with 0 V lock suture in superficial fascia and dermis. Skin closure completed with 4-0 monocryl subcuticular and tissue glue applied. The latissimus flap was placed in chest cavity, mastectomy flaps stapled over flap. The patient was placed in supine position and re prepped and draped.   The flap muscle was redraped withinrightchest.The entire skin paddle brought with latissimus was removed.The muscle was secured to pectoralis and serratus muscle with interrupted 2-0 PDS. 2 Fr JP drain placed in breast cavity and secured to chest with 2-0 nylon. A sizer was placed beneath latissimus muscle.  Over right chest, incision made in prior inframammary fold scar and carried through superficial fascia and acellular dermis. Expanderremoved.Acellular dermis noted to be completely incorporated. Capsulotomies performed superiorly. Sizer placed.  Patient brought to upright sitting  position and assessed for symmetry.A NatrelleInspira Smooth Round Extra Projection 470 ml implant selected for right chest and 400 ml  implant for left chest.  Patient returned to supine position.Bilateral cavities irrigated with solution containing Ancef, gentamicin,and bacitracin.hemostasis obtained.Cavities then irrigated with Betadine. Implant placed in right chest cavity. Superficial fascia and ADM closed with running 3-0 vicryl followed by 4-0 vicryl in dermis, 4-0 monocryl subcuticular for skin closure.  Left chest implant placed below latissimus muscle.Remainder oflatissimus muscle sutured to chest wall withPDS sutures.Closure incision completed with 3-0 monocryl in dermis, 4-0 monocryl for skin closure.  Axillary incision closed with 4-0 vicryl in dermis, 4-0 monocryl subcuticular skin closure.   Tissue adhesive was applied to breast and back incisions. Dry dressing and breast binder applied  The patient was allowed to wake from anesthesia, extubated and taken to the recovery room in satisfactory condition.   SPECIMENS: left chest peri implant fluid for cytology  DRAINS: 15 Fr JP in left back, 19Fr JP in left breast reconstruction  Irene Limbo, MD [facility] Plastic & Reconstructive Surgery [phone], pin [phone]

## 2018-08-17 NOTE — H&P (Signed)
Subjective:     Patient ID: Darlene Kline is a 37 y.o. female.  HPI  Presents today with recurrent swelling area of radiated chest. This first started now nearly month ago. She underwent aspiration old bloody fluid 2 weeks ago, culture S. Epi, completed Bactrim course.   Presented with palpable left breast mass. MMG revealed a mass in the left retroareolar region with microcalcifications extending from the mass to the base of the nipple spanning 4.8 cm from the posterior border of the mass to the nipple base.US showed an irregular mass at the 530 position of the left breast 2 cmfn measuring 1.8 x 2.9 x 3 cm with  duct extension to the nipple base. An additional mass at 3 o'clock position of the left breast 1 cmfn measuring 0.7 x 1.3 x 1.7 cm located 1.2 cm from the larger mass. [place] of axilla demonstrated at least 2 abnormal LN. Biopsy of 5:30 mass with IDC, ER/PR+, Her2 -. LN biopsy positive for metastatic disease.  invasive ductal carcinoma at the 530 o'clock position. Additional biopsy 2 o clock with UDH.   Genetics with VUS CHEK2. Staging scans negative for distant disease.  Neoadjuvant chemotherapy completed 1.18.19. Final MRI noted indeterminate 5 mm enhancing mass in UIQ RIGHT breast; second look [place] recommended. Near complete resolution of the enhancing masses in the left breast and normal size of the abnormal left axillary LN.  Final pathology right benign, left largest focus residual IDC 0.3 cm, +4/13 LN, margins negative. Completed adjuvant radiation 5.13.19. On anastrazole, Lupron. Currently receiving Kadcyla.    Prior 36 C, noted left larger than left and reports right "more like a B." Right mastectomy 351 g, left 538 g  Lives with husband and keds ages 62 and 73. 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  Review of Systems     Objective:   Physical Exam  Cardiovascular: Normal rate, regular rhythm and normal heart sounds.    Pulmonary/Chest: Effort normal and breath sounds normal.   Chest: left chest with hyperpigmentation, firm, no cellulitis Over UIQ left chest area ballotable, dry scab 1 cm Right benign soft     Assessment:     Left breast ca, metastatic to LN Neoadjuvant chemotherapy S/p Right NSM, left total mastectomy, left ALND, bilateral prepectoral TE/ADM reconstruction (Alloderm) Adjuvant radiation    Plan:     Recurrent fluid collection on left. With 25 ga needle, aspirated approximately 60 ml of dark old blood watery fluid.  Unclear reason for bleeding- she has been doing massage to area per recommendation of Radiation Oncology. It is possible this led to closed capsulotomy with bleeding. At this time I am concerned about threatened exposure expander/full thickness ulceration. I recommend we proceed with surgery for implant exchange to try to salvage reconstruction ASAP. Given her history radiation, the threatened ulceration soft tissue flap and the significant radiation changes to soft tissue, I recommend latissimus flap to left chest to cover implant. Discussed donor site scar drains hospital stay time off work.   Provided [place] handout for review. Discussed saline vs silicone, shaped vs round, textured vs smooth. Reviewed MRI or [place] surveillance with silicone, it may offer less visible rippling. She had questions regarding shaped- reviewed that these are all textured devices, risk of ALCL with texturing. Plan smooth round silicone.  With regards to size, reviewed will be largely based on CW width. She would like to be larger than present. Counseled will order range of sizes but ultimately  will need to select implant based on CW and ability to close soft tissue without undue tension on left.  We discussed fat grafting- given the above concern, I recommend we defer this at this time. Counseled can be done in future.  Natrelle 133FV-11-T 300 ml tissue expanders placed bilateral,  fill volume  300 ml saline left,  right 315 ml total fill volume   Irene Limbo, MD [facility] Plastic & Reconstructive Surgery [phone], pin [phone]

## 2018-08-17 NOTE — Transfer of Care (Signed)
Immediate Anesthesia Transfer of Care Note  Patient: Darlene Kline  Procedure(s) Performed: REMOVAL OF BILATERAL TISSUE EXPANDERS WITH PLACEMENT OF BILATERAL SILICONE BREAST IMPLANTS (Bilateral Breast) LEFT LATISSIMUS FLAP (Left Chest)  Patient Location: PACU  Anesthesia Type:General  Level of Consciousness: drowsy and patient cooperative  Airway & Oxygen Therapy: Patient Spontanous Breathing and Patient connected to nasal cannula oxygen  Post-op Assessment: Report given to RN and Post -op Vital signs reviewed and stable  Post vital signs: Reviewed and stable  Last Vitals:  Vitals Value Taken Time  BP 132/92 08/17/2018  4:13 PM  Temp    Pulse 106 08/17/2018  4:16 PM  Resp 16 08/17/2018  4:16 PM  SpO2 100 % 08/17/2018  4:16 PM  Vitals shown include unvalidated device data.  Last Pain:  Vitals:   08/17/18 1012  PainSc: 2          Complications: No apparent anesthesia complications

## 2018-08-18 DIAGNOSIS — Z421 Encounter for breast reconstruction following mastectomy: Secondary | ICD-10-CM | POA: Diagnosis not present

## 2018-08-18 MED ORDER — METHOCARBAMOL 500 MG PO TABS: 500.0000 mg | ORAL_TABLET | Freq: Three times a day (TID) | ORAL | 0 refills | Status: DC | PRN

## 2018-08-18 MED ORDER — OXYCODONE HCL 5 MG PO TABS: 5.0000 mg | ORAL_TABLET | ORAL | 0 refills | Status: DC | PRN

## 2018-08-18 MED ORDER — SULFAMETHOXAZOLE-TRIMETHOPRIM 800-160 MG PO TABS: 1.0000 | ORAL_TABLET | Freq: Two times a day (BID) | ORAL | 0 refills | Status: DC

## 2018-08-18 NOTE — Discharge Summary (Signed)
Physician Discharge Summary  Patient ID: JETTA MURRAY MRN: [id number] DOB/AGE: 12/29/1980 37 y.o.  Admit date: 08/17/2018 Discharge date: 08/18/2018  Admission Diagnoses:  History breast cancer, acquired absence breasts, history therapeutic radiation, seroma left chest  Discharge Diagnoses:  Same  Discharged Condition: stable  Hospital Course: Post operatively patient had nausea that resolved by POD#1. Tolerating diet and minimal pain requirements. Plan home today if able to ambulate.  Treatments: surgery: removal bilateral chest tissue expanders and placement silicone implants, left latissimus dorsi flap to left chest 9.27.19  Discharge Exam: Blood pressure 114/67, pulse 82, temperature 98.1 F (36.7 C), temperature source Oral, resp. rate 16, height 5\' 3"  (1.6 m), weight 58.5 kg, SpO2 100 %. Incision/Wound: back flat, chest soft with intact incisions no drainage, drains serosanguinous  Disposition:    Allergies as of 08/18/2018      Reactions   Adhesive [tape] Other (See Comments)   Sensitive to skin Hyperfix (white tape)       Medication List    STOP taking these medications   KADCYLA 100 MG Solr Generic drug:  ado-trastuzumab emtansine     TAKE these medications   anastrozole 1 MG tablet Commonly known as:  ARIMIDEX Take 1 mg by mouth daily.   leuprolide 11.25 MG injection Commonly known as:  LUPRON Inject 11.25 mg into the muscle every 21 ( twenty-one) days.   methocarbamol 500 MG tablet Commonly known as:  ROBAXIN Take 1 tablet (500 mg total) by mouth every 8 (eight) hours as needed for muscle spasms.   OVER THE COUNTER MEDICATION Take 2 tablets by mouth daily. Liver focus otc supplement   oxyCODONE 5 MG immediate release tablet Commonly known as:  Oxy IR/ROXICODONE Take 1-2 tablets (5-10 mg total) by mouth every 4 (four) hours as needed for moderate pain.   sulfamethoxazole-trimethoprim 800-160 MG tablet Commonly known as:  BACTRIM DS,SEPTRA  DS Take 1 tablet by mouth 2 (two) times daily.   traZODone 50 MG tablet Commonly known as:  DESYREL Take 50 mg by mouth at bedtime as needed for sleep.   WOMENS ONE DAILY Tabs Take 2 tablets by mouth daily.      Follow-up Information    Irene Limbo, MD In 1 week.   Specialty:  Plastic Surgery Why:  as scheduled Contact information: [address] [place] [address] [phone]           Signed: Irene Limbo 08/18/2018, 10:27 AM

## 2018-08-18 NOTE — Progress Notes (Signed)
Jimmye Norman to be D/C'd  per MD order. Discussed with the patient and all questions fully answered.  VSS, Skin clean, dry and intact without evidence of skin break down, no evidence of skin tears noted.  IV catheter discontinued intact. Site without signs and symptoms of complications. Dressing and pressure applied.  Educated patient on drain care and patient performed teach back.  An After Visit Summary was printed and given to the patient. Patient received prescription.  D/c education completed with patient/family including follow up instructions, medication list, d/c activities limitations if indicated, with other d/c instructions as indicated by MD - patient able to verbalize understanding, all questions fully answered.   Patient instructed to return to ED, call 911, or call MD for any changes in condition.   Patient to be escorted via [place], and D/C home via private auto.

## 2018-08-18 NOTE — Plan of Care (Signed)
  Problem: Education: Goal: Knowledge of General Education information will improve Description: Including pain rating scale, medication(s)/side effects and non-pharmacologic comfort measures Outcome: Progressing   Problem: Coping: Goal: Level of anxiety will decrease Outcome: Progressing   Problem: Safety: Goal: Ability to remain free from injury will improve Outcome: Progressing   

## 2018-08-18 NOTE — Progress Notes (Signed)
POD#1 removal bilateral TE and placement implants left latissimus flap  Nausea last evening resolved eating breakfast Has not been oob except to bedside commode\ No IV pain meds post op  Temp:  [97 F (36.1 C)-98.5 F (36.9 C)] 98.1 F (36.7 C) (09/28 0940) Pulse Rate:  [65-123] 82 (09/28 0940) Resp:  [10-19] 16 (09/28 0940) BP: (106-139)/(64-103) 114/67 (09/28 0940) SpO2:  [100 %] 100 % (09/28 0940)   PE Alert NAD Back: flat incision intact JPs serosanguinous Chest bilateral IMF and left medial chest incisions dry intact with glue in place Soft  A/P Provided implant card reviewed intraop findings No implant massage HL IVF Ambulate- if able to do this today then plan home  Irene Limbo, MD [facility] Plastic & Reconstructive Surgery [phone], pin [phone]

## 2018-08-20 NOTE — Anesthesia Postprocedure Evaluation (Signed)
Anesthesia Post Note  Patient: Darlene Kline  Procedure(s) Performed: REMOVAL OF BILATERAL TISSUE EXPANDERS WITH PLACEMENT OF BILATERAL SILICONE BREAST IMPLANTS (Bilateral Breast) LEFT LATISSIMUS FLAP (Left Chest)     Patient location during evaluation: PACU Anesthesia Type: General Level of consciousness: awake and alert Pain management: pain level controlled Vital Signs Assessment: post-procedure vital signs reviewed and stable Respiratory status: spontaneous breathing, nonlabored ventilation, respiratory function stable and patient connected to nasal cannula oxygen Cardiovascular status: blood pressure returned to baseline and stable Postop Assessment: no apparent nausea or vomiting Anesthetic complications: no    Last Vitals:  Vitals:   08/18/18 0940 08/18/18 1450  BP: 114/67 111/74  Pulse: 82 84  Resp: 16 16  Temp: 36.7 C 36.9 C  SpO2: 100% 98%    Last Pain:  Vitals:   08/18/18 1450  TempSrc: Oral  PainSc:                  Darlene Kline S

## 2018-08-21 ENCOUNTER — Encounter (HOSPITAL_COMMUNITY): Payer: Self-pay | Admitting: Plastic Surgery

## 2018-08-23 DIAGNOSIS — Z3490 Encounter for supervision of normal pregnancy, unspecified, unspecified trimester: Secondary | ICD-10-CM | POA: Insufficient documentation

## 2018-09-04 ENCOUNTER — Ambulatory Visit: Payer: Managed Care, Other (non HMO) | Admitting: Cardiology

## 2018-09-07 ENCOUNTER — Ambulatory Visit (INDEPENDENT_AMBULATORY_CARE_PROVIDER_SITE_OTHER): Payer: Managed Care, Other (non HMO) | Admitting: Cardiology

## 2018-09-07 ENCOUNTER — Encounter: Payer: Self-pay | Admitting: Cardiology

## 2018-09-07 DIAGNOSIS — I427 Cardiomyopathy due to drug and external agent: Secondary | ICD-10-CM | POA: Diagnosis not present

## 2018-09-07 DIAGNOSIS — T451X5A Adverse effect of antineoplastic and immunosuppressive drugs, initial encounter: Secondary | ICD-10-CM

## 2018-09-07 MED ORDER — VALSARTAN 40 MG PO TABS: 40.0000 mg | ORAL_TABLET | Freq: Two times a day (BID) | ORAL | 3 refills | Status: DC

## 2018-09-07 MED ORDER — METOPROLOL SUCCINATE ER 25 MG PO TB24: 25.0000 mg | ORAL_TABLET | Freq: Every day | ORAL | 3 refills | Status: DC

## 2018-09-07 NOTE — Progress Notes (Signed)
Cardiology Office Note:    Date:  09/07/2018   ID:  Darlene Kline, DOB [birth year], MRN [id number]  PCP:  Clydie Braun, MD  Cardiologist:  Shirlee More, MD   Referring MD: Clydie Braun, MD  ASSESSMENT:    1. Cardiomyopathy secondary to chemotherapy [facility])    PLAN:    In order of problems listed above:  1. She has evidence of cardiomyopathy with a combination of Adriamycin and Kadcyla.  Although she has no evidence of volume overload she is symptomatic with exertional shortness of breath and mild orthopnea and she is begun to sleep on 2 pillows.  I will initiate therapy beta-blocker as well as ARB check a proBNP level and if elevated I will add a low-dose loop diuretic.  The difficult issue is whether this is type I toxicity from Adriamycin or reversible type II toxicity from [facility].  Her follow-up echocardiogram scheduled 09/16/2018 should give [place] insight and if the ejection fraction is normalized with withdrawal treatment would be more in keeping with type II if function is unimproved or worsened more consistent with type I from Adriamycin.  I will ask her advanced heart failure specialist Dr. Haroldine Laws to see her in follow-up.  For the present time I would hold on additional treatment with the Kadcyla.  I reviewed these findings with patient she has a high healthcare knowledge and she is in agreement  Next appointment 2 weeks with advanced heart failure   Medication Adjustments/Labs and Tests Ordered: Current medicines are reviewed at length with the patient today.  Concerns regarding medicines are outlined above.  No orders of the defined types were placed in this encounter.  No orders of the defined types were placed in this encounter.    Chief Complaint  Patient presents with  . Cardiomyopathy    with chemotherapy    History of Present Illness:    Darlene Kline is a 37 y.o. 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 Prior to the visit I reviewed up-to-date as well as the reference below. Cardiotoxicity: An Unexpected Consequence of HER2-Targeted Therapies Apr 28, 2015  Laurian Brim, MD, [facility]  Expert Analysis The principle adverse event noted in  trials was cardiac dysfunction with an incidence of 27% in metastatic patients who received anthracycline, cyclophosphamide and trastuzumab with an incidence of 27% in metastatic patients who received anthracycline, cyclophosphamide and trastuzumab and an incidence of New York Heart Association Class III or IV heart failure (HF) of 4.1% in the adjuvant setting.3,4 Cardiac dysfunction with trastuzumab is often asymptomatic but can be symptomatic. Cardiotoxicity from trastuzumab is largely reversible and therefore characterized as causing type II chemotherapy-related cardiac dysfunctionThe greatest risk of cardiotoxicity from trastuzumab is in patients receiving concurrent anthracycline.6,14. Management of LV dysfunction resulting from HER2-targeted therapy has not been studied in large prospective clinical trials.33 ACEIs and beta blockers have been used in case series and small trials of patients with trastuzumab-induced LV dysfunction with promising results.34,35 It is not clear how long after treatment patients should remain on these drugs because this has also not been directly studied.7   During chemotherapy with Kadcyla she developed hypertension systolics [id number] diastolics 09-326 and transiently took a  thiazide diuretic.  Her blood pressure regressed.  Recently in the last week she is noted exertional shortness of breath when she goes to the football game walking  uphill she has mild orthopnea sleeping on 2 pillows she also has noted increasing fatigue out of proportion to what she experienced early in her disease process.  She has had no chest pain palpitation syncope or TIA she has no background history of congenital rheumatic heart disease.  She has had no edema.  Recent echocardiogram showed reduced EF confirmed on gated pool study.   Past Medical History:  Diagnosis Date  . Anemia    off and on d/t chemo  . Breast cancer, left breast ([place])   . 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 BREAST BIOPSY Left 07/2018  . CESAREAN SECTION  2013; 2017  . DILATION AND CURETTAGE OF UTERUS  2000  . LATISSIMUS FLAP TO BREAST Left 08/17/2018   Procedure: LEFT LATISSIMUS FLAP;  Surgeon: Irene Limbo, MD;  Location: [place];  Service: Plastics;  Laterality: Left;  Marland Kitchen MASTECTOMY Left 01/04/2018  . MASTECTOMY Right 01/04/2018   PROPHYLACTIC NIPPLE SPARING MASTECTOMY  . MASTECTOMY MODIFIED RADICAL Left 01/04/2018   Procedure: LEFT MASTECTOMY MODIFIED RADICAL;  Surgeon: Rolm Bookbinder, MD;  Location: [place];  Service: General;  Laterality: Left;  . NIPPLE SPARING MASTECTOMY Right 01/04/2018   Procedure: RIGHT PROPHYLACTIC NIPPLE SPARING MASTECTOMY;  Surgeon: Rolm Bookbinder, MD;  Location: [place];  Service: General;  Laterality: Right;  . PORTA CATH INSERTION Right 2018  . RECONSTRUCTION BREAST IMMEDIATE / DELAYED W/ TISSUE EXPANDER Bilateral 01/04/2018    AND ALLODERM  . REMOVAL OF BILATERAL TISSUE EXPANDERS WITH PLACEMENT OF BILATERAL BREAST IMPLANTS Bilateral 08/17/2018   Procedure: REMOVAL OF BILATERAL TISSUE EXPANDERS WITH PLACEMENT OF BILATERAL SILICONE BREAST IMPLANTS;  Surgeon: Irene Limbo, MD;  Location: [place];  Service: Plastics;  Laterality: Bilateral;    Current Medications: Current Meds  Medication Sig  . ado-trastuzumab emtansine  (KADCYLA) 100 MG SOLR Inject into the vein every 21 ( twenty-one) days.  Marland Kitchen anastrozole (ARIMIDEX) 1 MG tablet Take 1 mg by mouth daily.  Marland Kitchen leuprolide (LUPRON) 11.25 MG injection Inject 11.25 mg into the muscle every 21 ( twenty-one) days.  . methocarbamol (ROBAXIN) 500 MG tablet Take 1 tablet (500 mg total) by mouth every 8 (eight) hours as needed for muscle spasms.  Marland Kitchen oxyCODONE (OXY IR/ROXICODONE) 5 MG immediate release tablet Take 1-2 tablets (5-10 mg total) by mouth every 4 (four) hours as needed for moderate pain.     Allergies:   Adhesive [tape]   Social History   Socioeconomic History  . Marital status: Married    Spouse name: Not on file  . Number of children: Not on file  . Years of education: Not on file  . Highest education level: Not on file  Occupational History  . Not on file  Social Needs  . Financial resource strain: Not on file  . Food insecurity:    Worry: Not on file    Inability: Not on file  . Transportation needs:    Medical: Not on file    Non-medical: Not on file  Tobacco Use  . Smoking status: Never Smoker  . Smokeless tobacco: Never Used  Substance and Sexual Activity  . Alcohol use: Not Currently    Comment: 01/04/2018 'might drink twice/year"  .  Drug use: No  . Sexual activity: Yes  Lifestyle  . Physical activity:    Days per week: Not on file    Minutes per session: Not on file  . Stress: Not on file  Relationships  . Social connections:    Talks on phone: Not on file    Gets together: Not on file    Attends religious service: Not on file    Active member of club or organization: Not on file    Attends meetings of clubs or organizations: Not on file    Relationship status: Not on file  Other Topics Concern  . Not on file  Social History Narrative  . Not on file     Family History: The patient's family history includes Heart attack in her maternal grandfather; Hypertension in her mother.  ROS:   Review of Systems  Constitution:  Positive for malaise/fatigue.  HENT: Negative.   Cardiovascular: Positive for orthopnea.  Respiratory: Positive for cough and shortness of breath.   Endocrine: Negative.   Hematologic/Lymphatic: Negative.   Skin: Positive for rash.  Musculoskeletal: Negative.   Gastrointestinal: Negative.   Genitourinary: Negative.   Neurological: Negative.   Psychiatric/Behavioral: Negative.   Allergic/Immunologic: Negative.    Please see the history of present illness.     All other systems reviewed and are negative.  EKGs/Labs/Other Studies Reviewed:    The following studies were reviewed today:   EKG:  EKG is  ordered today.  The ekg ordered today demonstrates sinus rhythm and is normal  Summary of chest Echocardiogram 04/23/2018 EF 55 to 60% Echocardiogram 08/03/2018 technically difficult study left ventricle normal size mild to moderately reduced LV systolic function with global hypokinesia ejection fraction estimated 40 to 45%  MUGA 08/17/18 calculated ejection fraction 40%  Recent Labs:   08/10/2018 hemoglobin 10.7 platelets 107000 04/11/2018: ALT 20 08/14/2018: BUN 6; Creatinine, Ser 0.56; Hemoglobin 10.3; Platelets 94; Potassium 3.8; Sodium 138  Recent Lipid Panel No results found for: CHOL, TRIG, HDL, CHOLHDL, VLDL, LDLCALC, LDLDIRECT  Physical Exam:    VS:  BP 132/90 (BP Location: Right Arm, Patient Position: Sitting, Cuff Size: Normal)   Pulse 86   Ht _0  (1.6 m)   Wt 130 lb 1.9 oz (59 kg)   SpO2 98%   BMI 23.05 kg/m     Wt Readings from Last 3 Encounters:  09/07/18 130 lb 1.9 oz (59 kg)  08/17/18 129 lb (58.5 kg)  08/14/18 131 lb 11.2 oz (59.7 kg)     GEN:  Well nourished, well developed in no acute distress HEENT: Normal NECK: No JVD; No carotid bruits LYMPHATICS: No lymphadenopathy CARDIAC: RRR, no murmurs, rubs, gallops RESPIRATORY:  Clear to auscultation without rales, wheezing or rhonchi  ABDOMEN: Soft, non-tender, non-distended MUSCULOSKELETAL:  No edema;  No deformity  SKIN: Warm and dry NEUROLOGIC:  Alert and oriented x 3 PSYCHIATRIC:  Normal affect     Signed, Shirlee More, MD  09/07/2018 2:37 PM    [place]

## 2018-09-07 NOTE — Addendum Note (Signed)
Addended by: Austin Miles on: 09/07/2018 02:57 PM   Modules accepted: Orders

## 2018-09-07 NOTE — Patient Instructions (Signed)
Medication Instructions:  Your physician has recommended you make the following change in your medication:   START metoprolol succinate (toprol XL) 25 mg: Take 1 tablet daily.  START valsartan (Diovan) 40 mg: Take 1 tablet twice daily.   If you need a refill on your cardiac medications before your next appointment, please call your pharmacy.   Lab work: Your physician recommends that you return for lab work today: [place].   If you have labs (blood work) drawn today and your tests are completely normal, you will receive your results only by: Marland Kitchen MyChart Message (if you have MyChart) OR . A paper copy in the mail If you have any lab test that is abnormal or we need to change your treatment, we will call you to review the results.  Testing/Procedures: You had an EKG today.   Your physician has requested that you have an echocardiogram. Echocardiography is a painless test that uses sound waves to create images of your heart. It provides your doctor with information about the size and shape of your heart and how well your heart's chambers and valves are working. This procedure takes approximately one hour. There are no restrictions for this procedure.  You have been referred to the Congestive Heart Failure (CHF) clinic. You will be contacted to schedule an appointment with Dr. Haroldine Laws.   Follow-Up: At [facility], you and your health needs are our priority.  As part of our continuing mission to provide you with exceptional heart care, we have created designated Provider Care Teams.  These Care Teams include your primary Cardiologist (physician) and Advanced Practice Providers (APPs -  Physician Assistants and Nurse Practitioners) who all work together to provide you with the care you need, when you need it. You will need a follow up appointment as needed or if/when Dr. Haroldine Laws recommends.    Echocardiogram An echocardiogram, or echocardiography, uses sound waves (ultrasound) to produce  an image of your heart. The echocardiogram is simple, painless, obtained within a short period of time, and offers valuable information to your health care provider. The images from an echocardiogram can provide information such as:  Evidence of coronary artery disease (CAD).  Heart size.  Heart muscle function.  Heart valve function.  Aneurysm detection.  Evidence of a past heart attack.  Fluid buildup around the heart.  Heart muscle thickening.  Assess heart valve function.  Tell a health care provider about:  Any allergies you have.  All medicines you are taking, including vitamins, herbs, eye drops, creams, and over-the-counter medicines.  Any problems you or family members have had with anesthetic medicines.  Any blood disorders you have.  Any surgeries you have had.  Any medical conditions you have.  Whether you are pregnant or may be pregnant. What happens before the procedure? No special preparation is needed. Eat and drink normally. What happens during the procedure?  In order to produce an image of your heart, gel will be applied to your chest and a wand-like tool (transducer) will be moved over your chest. The gel will help transmit the sound waves from the transducer. The sound waves will harmlessly bounce off your heart to allow the heart images to be captured in real-time motion. These images will then be recorded.  You may need an IV to receive a medicine that improves the quality of the pictures. What happens after the procedure? You may return to your normal schedule including diet, activities, and medicines, unless your health care provider tells you otherwise.  This information is not intended to replace advice given to you by your health care provider. Make sure you discuss any questions you have with your health care provider. Document Released: 11/04/2000 Document Revised: 06/25/2016 Document Reviewed: 07/15/2013 Elsevier Interactive Patient  Education  2017 [place].   Valsartan tablets What is this medicine? VALSARTAN (val SAR tan) is used to treat high blood pressure. This drug is also used to treat patients with heart failure and patients who have had a heart attack. This medicine may be used for other purposes; ask your health care provider or pharmacist if you have questions. COMMON BRAND NAME(S): Diovan What should I tell my health care provider before I take this medicine? They need to know if you have any of these conditions: -heart failure -kidney disease -liver disease -an unusual or allergic reaction to valsartan, other medicines, foods, dyes, or preservatives -pregnant or trying to get pregnant -breast-feeding How should I use this medicine? Take this medicine by mouth with a glass of water. Follow the directions on the prescription label. This medicine can be taken with or without food. Take your medicine at regular intervals. Do not take it more often than directed. Talk to your pediatrician regarding the use of this medicine in children. While this drug may be prescribed for children as young as 6 years for selected conditions, precautions do apply. Overdosage: If you think you have taken too much of this medicine contact a poison control center or emergency room at once. NOTE: This medicine is only for you. Do not share this medicine with others. What if I miss a dose? If you miss a dose, take it as soon as you can. If it is almost time for your next dose, take only that dose. Do not take double or extra doses. What may interact with this medicine? -blood pressure medicines -lithium -diuretics, especially triamterene, spironolactone or amiloride -potassium salts or potassium supplements This list may not describe all possible interactions. Give your health care provider a list of all the medicines, herbs, non-prescription drugs, or dietary supplements you use. Also tell them if you smoke, drink alcohol,  or use illegal drugs. Some items may interact with your medicine. What should I watch for while using this medicine? Visit your doctor or health care professional for regular checks on your progress. Check your blood pressure as directed. Ask your doctor or health care professional what your blood pressure should be and when you should contact him or her. Call your doctor or health care professional if you notice an irregular or fast heart beat. Women should inform their doctor if they wish to become pregnant or think they might be pregnant. There is a potential for serious side effects to an unborn child, particularly in the second or third trimester. Talk to your health care professional or pharmacist for more information. You may get drowsy or dizzy. Do not drive, use machinery, or do anything that needs mental alertness until you know how this drug affects you. Do not stand or sit up quickly, especially if you are an older patient. This reduces the risk of dizzy or fainting spells. Alcohol can make you more drowsy and dizzy. Avoid alcoholic drinks. Avoid salt substitutes unless you are told otherwise by your doctor or health care professional. Do not treat yourself for coughs, colds, or pain while you are taking this medicine without asking your doctor or health care professional for advice. Some ingredients may increase your blood pressure. What side effects may  I notice from receiving this medicine? Side effects that you should report to your doctor or health care professional as soon as possible: -confusion, dizziness, light headedness or fainting spells -decreased amount of urine passed -difficulty breathing or swallowing, hoarseness, or tightening of the throat -fast or irregular heart beat, palpitations, or chest pain -skin rash, itching -swelling of your face, lips, tongue, hands, or feet Side effects that usually do not require medical attention (report to your doctor or health care  professional if they continue or are bothersome): -cough -decreased sexual function -headache -nausea or stomach pain This list may not describe all possible side effects. Call your doctor for medical advice about side effects. You may report side effects to FDA at 1-800-FDA-1088. Where should I keep my medicine? Keep out of the reach of children. Store at room temperature between 15 and 30 degrees C (59 and 86 degrees F). Keep your medicine container tightly closed and protect from moisture. Throw away any unused medicine after the expiration date. NOTE: This sheet is a summary. It may not cover all possible information. If you have questions about this medicine, talk to your doctor, pharmacist, or health care provider.  2018 Elsevier/Gold Standard (2013-02-07 12:39:59)   Metoprolol extended-release tablets What is this medicine? METOPROLOL (me TOE proe lole) is a beta-blocker. Beta-blockers reduce the workload on the heart and help it to beat more regularly. This medicine is used to treat high blood pressure and to prevent chest pain. It is also used to after a heart attack and to prevent an additional heart attack from occurring. This medicine may be used for other purposes; ask your health care provider or pharmacist if you have questions. COMMON BRAND NAME(S): toprol, Toprol XL What should I tell my health care provider before I take this medicine? They need to know if you have any of these conditions: -diabetes -heart or vessel disease like slow heart rate, worsening heart failure, heart block, sick sinus syndrome or Raynaud's disease -kidney disease -liver disease -lung or breathing disease, like asthma or emphysema -pheochromocytoma -thyroid disease -an unusual or allergic reaction to metoprolol, other beta-blockers, medicines, foods, dyes, or preservatives -pregnant or trying to get pregnant -breast-feeding How should I use this medicine? Take this medicine by mouth with a  glass of water. Follow the directions on the prescription label. Do not crush or chew. Take this medicine with or immediately after meals. Take your doses at regular intervals. Do not take more medicine than directed. Do not stop taking this medicine suddenly. This could lead to serious heart-related effects. Talk to your pediatrician regarding the use of this medicine in children. While this drug may be prescribed for children as young as 6 years for selected conditions, precautions do apply. Overdosage: If you think you have taken too much of this medicine contact a poison control center or emergency room at once. NOTE: This medicine is only for you. Do not share this medicine with others. What if I miss a dose? If you miss a dose, take it as soon as you can. If it is almost time for your next dose, take only that dose. Do not take double or extra doses. What may interact with this medicine? This medicine may interact with the following medications: -certain medicines for blood pressure, heart disease, irregular heart beat -certain medicines for depression, like monoamine oxidase (MAO) inhibitors, fluoxetine, or paroxetine -clonidine -dobutamine -epinephrine -isoproterenol -reserpine This list may not describe all possible interactions. Give your health care provider a  list of all the medicines, herbs, non-prescription drugs, or dietary supplements you use. Also tell them if you smoke, drink alcohol, or use illegal drugs. Some items may interact with your medicine. What should I watch for while using this medicine? Visit your doctor or health care professional for regular check ups. Contact your doctor right away if your symptoms worsen. Check your blood pressure and pulse rate regularly. Ask your health care professional what your blood pressure and pulse rate should be, and when you should contact them. You may get drowsy or dizzy. Do not drive, use machinery, or do anything that needs mental  alertness until you know how this medicine affects you. Do not sit or stand up quickly, especially if you are an older patient. This reduces the risk of dizzy or fainting spells. Contact your doctor if these symptoms continue. Alcohol may interfere with the effect of this medicine. Avoid alcoholic drinks. What side effects may I notice from receiving this medicine? Side effects that you should report to your doctor or health care professional as soon as possible: -allergic reactions like skin rash, itching or hives -cold or numb hands or feet -depression -difficulty breathing -faint -fever with sore throat -irregular heartbeat, chest pain -rapid weight gain -swollen legs or ankles Side effects that usually do not require medical attention (report to your doctor or health care professional if they continue or are bothersome): -anxiety or nervousness -change in sex drive or performance -dry skin -headache -nightmares or trouble sleeping -short term memory loss -stomach upset or diarrhea -unusually tired This list may not describe all possible side effects. Call your doctor for medical advice about side effects. You may report side effects to FDA at 1-800-FDA-1088. Where should I keep my medicine? Keep out of the reach of children. Store at room temperature between 15 and 30 degrees C (59 and 86 degrees F). Throw away any unused medicine after the expiration date. NOTE: This sheet is a summary. It may not cover all possible information. If you have questions about this medicine, talk to your doctor, pharmacist, or health care provider.  2018 Elsevier/Gold Standard (2013-07-12 14:41:37)

## 2018-09-08 LAB — BASIC METABOLIC PANEL
BUN/Creatinine Ratio: 14 (ref 9–23)
BUN: 9 mg/dL (ref 6–20)
CO2: 24 mmol/L (ref 20–29)
CREATININE: 0.64 mg/dL (ref 0.57–1.00)
Calcium: 9.5 mg/dL (ref 8.7–10.2)
Chloride: 100 mmol/L (ref 96–106)
GFR, EST AFRICAN AMERICAN: 134 mL/min/{1.73_m2} (ref >59)
GFR, EST NON AFRICAN AMERICAN: 116 mL/min/{1.73_m2} (ref >59)
Glucose: 81 mg/dL (ref 65–99)
POTASSIUM: 3.5 mmol/L (ref 3.5–5.2)
Sodium: 140 mmol/L (ref 134–144)

## 2018-09-08 LAB — PRO B NATRIURETIC PEPTIDE: NT-Pro BNP: 762 pg/mL — ABNORMAL HIGH (ref 0–130)

## 2018-09-10 ENCOUNTER — Telehealth: Payer: Self-pay

## 2018-09-10 MED ORDER — FUROSEMIDE 20 MG PO TABS: 20.0000 mg | ORAL_TABLET | Freq: Every day | ORAL | 3 refills | Status: DC

## 2018-09-10 MED ORDER — POTASSIUM CHLORIDE CRYS ER 20 MEQ PO TBCR: 20.0000 meq | EXTENDED_RELEASE_TABLET | Freq: Every day | ORAL | 3 refills | Status: DC

## 2018-09-10 NOTE — Telephone Encounter (Signed)
-----   Message from Richardo Priest, MD sent at 09/08/2018  7:28 AM EDT ----- Her BNP is elevated   Start furosemide 20 mg day and oral k 20 meq tag daily each 3 mos 3 refills

## 2018-09-10 NOTE — Telephone Encounter (Signed)
Left message for patient to return call.

## 2018-09-10 NOTE — Telephone Encounter (Signed)
Patient notified of elevated BNP level, and to start furosemide 20mg  once daily and potassium 20 meq once daily.   Patient verbalized understanding.

## 2018-09-17 ENCOUNTER — Other Ambulatory Visit (HOSPITAL_COMMUNITY): Payer: Managed Care, Other (non HMO)

## 2018-09-17 ENCOUNTER — Telehealth: Payer: Self-pay | Admitting: *Deleted

## 2018-09-17 NOTE — Telephone Encounter (Signed)
Patient called to let us know that she has not been contacted to schedule an appointment with the heart failure clinic. Advised patient I have reached out to one of the nurses in the clinic this morning, so she should be hearing from them in the next couple of days. Informed her to call us back if she does not hear from them and she would be scheduled to follow up with Dr. Bettina Gavia. Patient verbalized understanding. No further questions.

## 2018-09-19 ENCOUNTER — Encounter (INDEPENDENT_AMBULATORY_CARE_PROVIDER_SITE_OTHER): Payer: Self-pay

## 2018-09-19 ENCOUNTER — Ambulatory Visit (HOSPITAL_COMMUNITY): Payer: Managed Care, Other (non HMO) | Attending: Cardiology

## 2018-09-19 DIAGNOSIS — I427 Cardiomyopathy due to drug and external agent: Secondary | ICD-10-CM | POA: Diagnosis present

## 2018-09-19 DIAGNOSIS — T451X5A Adverse effect of antineoplastic and immunosuppressive drugs, initial encounter: Secondary | ICD-10-CM

## 2018-09-19 MED ORDER — PERFLUTREN LIPID MICROSPHERE
1.0000 mL | INTRAVENOUS | Status: AC | PRN
  Administered 2018-09-19: 2 mL via INTRAVENOUS

## 2018-09-24 NOTE — Telephone Encounter (Signed)
Patient advised since she has not heard from the CHF clinic regarding an appointment that Dr. Bettina Gavia wanted to see her in our office for follow up. Patient agreeable and scheduled for a visit on Thursday, 09/27/18 at 10:40 am. No further questions.

## 2018-09-26 NOTE — Progress Notes (Signed)
Cardiology Office Note:    Date:  09/27/2018   ID:  Darlene Kline, DOB [birth year], MRN [id number]  PCP:  No primary care provider on file.  Cardiologist:  Shirlee More, MD    Referring MD: Clydie Braun, MD    ASSESSMENT:    1. Cardiomyopathy secondary to chemotherapy [facility])    PLAN:    In order of problems listed above:  1. 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 Haroldine Laws who is the oncology cardiology specialist in our practice.  To continue her current medical regimen of loop diuretic beta-blocker and ARB which is controlled her hypertension.  She will need serial echocardiograms that she continues through treatment.   Next appointment: As needed she will be referred to our cardio oncology specialist   Medication Adjustments/Labs and Tests Ordered: Current medicines are reviewed at length with the patient today.  Concerns regarding medicines are outlined above.  Orders Placed This Encounter  Procedures  . Basic Metabolic Panel (BMET)  . Pro b natriuretic peptide (BNP)  . Troponin I   No orders of the defined types were placed in this encounter.   No chief complaint on file.   History of Present Illness:    Darlene Kline is a 37 y.o. female with a hx of cardiomyopathy and heart failure with chemotherapywith a combination of Adriamycin and Kadcyla.   last seen 09/07/18.  Echo 09/19/18:  The cavity size was normal. Wall thickness was normal. Systolic function was normal. The estimated ejection fraction was in the range of 50% to 55%. Wall motion was normal; there were no regional wall motion abnormalities. Left  ventricular diastolic function parameters were normal.   Ref Range & Units 2wk ago  NT-Pro BNP 0 - 130 pg/mL 762High       Compliance with diet, lifestyle  and medications: yes  Is improved almost immediately after starting a loop diuretic or shortness of breath resolved she feels well no orthopnea chest pain palpitation or syncope follow-up echocardiogram showed normalization of ejection fraction with medical treatment and a break in her chemotherapy regimen.  Her injury is type II I spoke to her oncology specialist Dr. Haroldine Laws she can resume her chemotherapy continue her current medical regimen and she will follow-up in consultation with him.  I did ask her to have BMP proBNP and troponin drawn today.   Past Medical History:  Diagnosis Date  . Anemia    off and on d/t chemo  . Breast cancer, left breast ([place])   . 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 BREAST BIOPSY Left 07/2018  . CESAREAN SECTION  2013; 2017  . DILATION AND CURETTAGE OF UTERUS  2000  . LATISSIMUS FLAP TO BREAST Left 08/17/2018   Procedure: LEFT LATISSIMUS FLAP;  Surgeon: Irene Limbo, MD;  Location: [place];  Service: Plastics;  Laterality: Left;  Marland Kitchen MASTECTOMY Left 01/04/2018  . MASTECTOMY Right 01/04/2018   PROPHYLACTIC NIPPLE SPARING MASTECTOMY  . MASTECTOMY MODIFIED RADICAL Left 01/04/2018   Procedure: LEFT MASTECTOMY MODIFIED RADICAL;  Surgeon: Rolm Bookbinder, MD;  Location: [place];  Service: General;  Laterality: Left;  . NIPPLE SPARING MASTECTOMY Right 01/04/2018   Procedure: RIGHT  PROPHYLACTIC NIPPLE SPARING MASTECTOMY;  Surgeon: Rolm Bookbinder, MD;  Location: [place];  Service: General;  Laterality: Right;  . PORTA CATH INSERTION Right 2018  . RECONSTRUCTION BREAST IMMEDIATE / DELAYED W/ TISSUE EXPANDER Bilateral 01/04/2018    AND ALLODERM  . REMOVAL OF BILATERAL TISSUE EXPANDERS WITH PLACEMENT OF BILATERAL BREAST IMPLANTS Bilateral 08/17/2018   Procedure: REMOVAL OF BILATERAL TISSUE EXPANDERS WITH PLACEMENT OF BILATERAL SILICONE BREAST IMPLANTS;   Surgeon: Irene Limbo, MD;  Location: [place];  Service: Plastics;  Laterality: Bilateral;    Current Medications: Current Meds  Medication Sig  . ado-trastuzumab emtansine (KADCYLA) 100 MG SOLR Inject into the vein every 21 ( twenty-one) days.  Marland Kitchen anastrozole (ARIMIDEX) 1 MG tablet Take 1 mg by mouth daily.  . furosemide (LASIX) 20 MG tablet Take 1 tablet (20 mg total) by mouth daily.  Marland Kitchen leuprolide (LUPRON) 11.25 MG injection Inject 11.25 mg into the muscle every 21 ( twenty-one) days.  . methocarbamol (ROBAXIN) 500 MG tablet Take 1 tablet (500 mg total) by mouth every 8 (eight) hours as needed for muscle spasms.  . metoprolol succinate (TOPROL-XL) 25 MG 24 hr tablet Take 1 tablet (25 mg total) by mouth daily.  Marland Kitchen oxyCODONE (OXY IR/ROXICODONE) 5 MG immediate release tablet Take 1-2 tablets (5-10 mg total) by mouth every 4 (four) hours as needed for moderate pain.  . potassium chloride SA (K-DUR,KLOR-CON) 20 MEQ tablet Take 1 tablet (20 mEq total) by mouth daily.  . valsartan (DIOVAN) 40 MG tablet Take 1 tablet (40 mg total) by mouth 2 (two) times daily.     Allergies:   Adhesive [tape]   Social History   Socioeconomic History  . Marital status: Married    Spouse name: Not on file  . Number of children: Not on file  . Years of education: Not on file  . Highest education level: Not on file  Occupational History  . Not on file  Social Needs  . Financial resource strain: Not on file  . Food insecurity:    Worry: Not on file    Inability: Not on file  . Transportation needs:    Medical: Not on file    Non-medical: Not on file  Tobacco Use  . Smoking status: Never Smoker  . Smokeless tobacco: Never Used  Substance and Sexual Activity  . Alcohol use: Not Currently    Comment: 01/04/2018 'might drink twice/year"  . Drug use: No  . Sexual activity: Yes  Lifestyle  . Physical activity:    Days per week: Not on file    Minutes per session: Not on file  . Stress: Not on file    Relationships  . Social connections:    Talks on phone: Not on file    Gets together: Not on file    Attends religious service: Not on file    Active member of club or organization: Not on file    Attends meetings of clubs or organizations: Not on file    Relationship status: Not on file  Other Topics Concern  . Not on file  Social History Narrative  . Not on file     Family History: The patient's family history includes Heart attack in her maternal grandfather; Hypertension in her mother. ROS:   Please see the history of present illness.    All other systems reviewed and are negative.  EKGs/Labs/Other Studies Reviewed:    The following studies were reviewed today:  Recent Labs: 04/11/2018: ALT 20 08/14/2018: Hemoglobin 10.3;  Platelets 94 09/07/2018: BUN 9; Creatinine, Ser 0.64; NT-Pro BNP 762; Potassium 3.5; Sodium 140  Recent Lipid Panel No results found for: CHOL, TRIG, HDL, CHOLHDL, VLDL, LDLCALC, LDLDIRECT  Physical Exam:    VS:  BP 126/90 (BP Location: Right Arm, Patient Position: Sitting, Cuff Size: Normal)   Pulse 80   Ht 5\' 3"  (1.6 m)   Wt 136 lb 12.8 oz (62.1 kg)   SpO2 98%   BMI 24.23 kg/m     Wt Readings from Last 3 Encounters:  09/27/18 136 lb 12.8 oz (62.1 kg)  09/07/18 130 lb 1.9 oz (59 kg)  08/17/18 129 lb (58.5 kg)     GEN:  Well nourished, well developed in no acute distress HEENT: Normal NECK: No JVD; No carotid bruits LYMPHATICS: No lymphadenopathy CARDIAC: RRR, no murmurs, rubs, gallops RESPIRATORY:  Clear to auscultation without rales, wheezing or rhonchi  ABDOMEN: Soft, non-tender, non-distended MUSCULOSKELETAL:  No edema; No deformity  SKIN: Warm and dry NEUROLOGIC:  Alert and oriented x 3 PSYCHIATRIC:  Normal affect    Signed, Shirlee More, MD  09/27/2018 1:19 PM    [facility]

## 2018-09-27 ENCOUNTER — Ambulatory Visit (INDEPENDENT_AMBULATORY_CARE_PROVIDER_SITE_OTHER): Payer: Managed Care, Other (non HMO) | Admitting: Cardiology

## 2018-09-27 VITALS — BP 126/90 | HR 80 | Ht 63.0 in | Wt 136.8 lb

## 2018-09-27 DIAGNOSIS — T451X5A Adverse effect of antineoplastic and immunosuppressive drugs, initial encounter: Secondary | ICD-10-CM | POA: Diagnosis not present

## 2018-09-27 DIAGNOSIS — I427 Cardiomyopathy due to drug and external agent: Secondary | ICD-10-CM | POA: Diagnosis not present

## 2018-09-27 NOTE — Patient Instructions (Addendum)
Medication Instructions:  Your physician recommends that you continue on your current medications as directed. Please refer to the Current Medication list given to you today.  If you need a refill on your cardiac medications before your next appointment, please call your pharmacy.   Lab work: Your physician recommends that you return for lab work today: BMP, ProBNP, Troponin I.   If you have labs (blood work) drawn today and your tests are completely normal, you will receive your results only by: Marland Kitchen MyChart Message (if you have MyChart) OR . A paper copy in the mail If you have any lab test that is abnormal or we need to change your treatment, we will call you to review the results.  Testing/Procedures: None  Follow-Up: At [facility], you and your health needs are our priority.  As part of our continuing mission to provide you with exceptional heart care, we have created designated Provider Care Teams.  These Care Teams include your primary Cardiologist (physician) and Advanced Practice Providers (APPs -  Physician Assistants and Nurse Practitioners) who all work together to provide you with the care you need, when you need it. You will need a follow up appointment as needed if symptoms worsen or fail to improve.    You have been referred to the [facility]est[facility] Heart Failure Clinic with Dr. Drema Balzarine. You will be contacted to schedule an appointment.

## 2018-09-28 ENCOUNTER — Telehealth: Payer: Self-pay | Admitting: *Deleted

## 2018-09-28 LAB — BASIC METABOLIC PANEL
BUN / CREAT RATIO: 22 (ref 9–23)
BUN: 15 mg/dL (ref 6–20)
CALCIUM: 10.1 mg/dL (ref 8.7–10.2)
CO2: 23 mmol/L (ref 20–29)
CREATININE: 0.69 mg/dL (ref 0.57–1.00)
Chloride: 100 mmol/L (ref 96–106)
GFR calc non Af Amer: 113 mL/min/{1.73_m2} (ref >59)
GFR, EST AFRICAN AMERICAN: 130 mL/min/{1.73_m2} (ref >59)
Glucose: 75 mg/dL (ref 65–99)
Potassium: 4.1 mmol/L (ref 3.5–5.2)
Sodium: 141 mmol/L (ref 134–144)

## 2018-09-28 LAB — TROPONIN I: Troponin I: <0.01 ng/mL (ref 0.00–0.04)

## 2018-09-28 LAB — PRO B NATRIURETIC PEPTIDE: NT-PRO BNP: 462 pg/mL — AB (ref 0–130)

## 2018-09-28 NOTE — Telephone Encounter (Signed)
Patient called back asking if she could go ahead and resume chemotherapy. Dr. Bettina Gavia agreed but does still want her to be seen by Dr. Haroldine Laws. Patient verbalized understanding. No further questions.

## 2018-09-28 NOTE — Telephone Encounter (Signed)
Left message on patient's cell phone per DPR informing her of lab results. Advised patient to contact us with any further questions or concerns.

## 2018-11-09 ENCOUNTER — Ambulatory Visit (HOSPITAL_COMMUNITY)
Admission: RE | Admit: 2018-11-09 | Discharge: 2018-11-09 | Disposition: A | Discharge status: Home / Self Care | Payer: Managed Care, Other (non HMO) | Source: Ambulatory Visit | Attending: Internal Medicine | Admitting: Internal Medicine

## 2018-11-09 ENCOUNTER — Encounter (HOSPITAL_COMMUNITY): Payer: Self-pay | Admitting: Internal Medicine

## 2018-11-09 VITALS — BP 130/86 | HR 80 | Wt 138.2 lb

## 2018-11-09 DIAGNOSIS — Z8249 Family history of ischemic heart disease and other diseases of the circulatory system: Secondary | ICD-10-CM | POA: Diagnosis not present

## 2018-11-09 DIAGNOSIS — T451X5A Adverse effect of antineoplastic and immunosuppressive drugs, initial encounter: Secondary | ICD-10-CM

## 2018-11-09 DIAGNOSIS — C773 Secondary and unspecified malignant neoplasm of axilla and upper limb lymph nodes: Secondary | ICD-10-CM | POA: Insufficient documentation

## 2018-11-09 DIAGNOSIS — C50912 Malignant neoplasm of unspecified site of left female breast: Secondary | ICD-10-CM | POA: Diagnosis present

## 2018-11-09 DIAGNOSIS — R030 Elevated blood-pressure reading, without diagnosis of hypertension: Secondary | ICD-10-CM | POA: Insufficient documentation

## 2018-11-09 DIAGNOSIS — Z79899 Other long term (current) drug therapy: Secondary | ICD-10-CM | POA: Insufficient documentation

## 2018-11-09 DIAGNOSIS — I427 Cardiomyopathy due to drug and external agent: Secondary | ICD-10-CM | POA: Diagnosis not present

## 2018-11-09 DIAGNOSIS — C50911 Malignant neoplasm of unspecified site of right female breast: Secondary | ICD-10-CM

## 2018-11-09 DIAGNOSIS — Z888 Allergy status to other drugs, medicaments and biological substances status: Secondary | ICD-10-CM | POA: Diagnosis not present

## 2018-11-09 MED ORDER — VALSARTAN 80 MG PO TABS: 80.0000 mg | ORAL_TABLET | Freq: Two times a day (BID) | ORAL | 6 refills | Status: DC

## 2018-11-09 NOTE — Patient Instructions (Signed)
STOP Furosemide  INCREASE Valsartan 80mg  (1 tab) twice daily  Your physician has requested that you have an echocardiogram. Echocardiography is a painless test that uses sound waves to create images of your heart. It provides your doctor with information about the size and shape of your heart and how well your heart's chambers and valves are working. This procedure takes approximately one hour. There are no restrictions for this procedure.  Follow up with Dr. Haroldine Laws in 1 month.

## 2018-11-09 NOTE — Progress Notes (Signed)
Cardio-Oncology Clinic Consult Note   Referring Physician: Dr. Bettina Gavia Primary Cardiologist: Dr. Bettina Gavia Primary Oncologist: Dr. Humphrey Rolls [facility])  HPI:  Darlene Kline is a 37 y.o. 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 Bettina Gavia to establish in the cardio-oncology clinic for evaluation of possible chemotherapy cardiotoxicity.   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 There is a superficial irregular hypoechoic mass at the 3 o'clock position of the left breast 1 cm from the nipple measuring 0.7 x 1.3 x 1.7 cm located 1.2 cm from the larger mass. Ultrasound of the left axilla demonstrated an abnormal 1 cm node over the region with near complete replacement of the fatty hilum. There was also a little much larger abnormal lymph node higher in the left axilla with cortical thickness of 8 mm.   07/07/2017 Bone scan     No evident bony metastatic disease.    07/17/2017 US Biopsy    Invasive ductal carcinoma with rare foci suspicious for high-grade ductal carcinoma in situ. Biopsy of the left axillary lymph node was consistent with metastatic carcinoma with breast primary. 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 Prognostic panel testing revealed the tumor to be HER-2 positive.   4/4 - 03/28/18 Radiation    February 22, 2018 through Mar 28, 2018. Patient experienced localized skin reaction including desquamation in the upper outer quadrant treated with topical creams.       She denies any h/o known heart disease. Found to have left breast CA in 8/18. Initially HER2-neu negative. Completed initial therapy with Taxotere, Adriamycin, Cyclophosphamide beginning 08/25/17 - 12/08/2017 x 6 cycles. After surgery found to be HER2+ and started on Kadcyla in 6/19. Echo 6/19 reported 55-60%. Echo in 9/19 showed EF 40-45% [facility]). Kadcyla stopped for 3 months. Started on valsartan and Toprol. Had f/u echo in 10/19 read as 50-55%. Since that time Kadcyla restarted and has received 2 cycles. Felt to be in complete remission now.   Today, she is feeling better than when her EF dropped. She is not SOB now. Earlier this year, it was mainly with steps or with any mild exercise. Works for [employer]. Denies CP, lightheadedness, or dizziness. Was having tachypalpitation, but have resolved. Has been taking lasix but says she is not sue if she needs it as she doesn't have swelling.    Echo 09/19/2018 shows 50-55%  MUGA 08/17/18 LVEF 40%, Limited exam because of overlying breast tissue expanders.  Echo 07/24/18 LVEF 40-45% [facility]) Echo 04/23/18 LVEF 55-60%, Mild/Mod MR [facility]) Echo 02/20/18 LVEF 55-60%, GLS -17.7. [facility])  Echo 08/07/17 LVEF "normal"  Review of Systems: [y] = yes, '[ ]'$  = no   General: Weight gain '[ ]'$ ; Weight loss '[ ]'$ ;  Anorexia '[ ]'$ ; Fatigue '[ ]'$ ; Fever '[ ]'$ ; Chills '[ ]'$ ; Weakness '[ ]'$   Cardiac: Chest pain/pressure '[ ]'$ ; Resting SOB '[ ]'$ ; Exertional SOB '[ ]'$ ; Orthopnea '[ ]'$ ; Pedal Edema '[ ]'$ ; Palpitations '[ ]'$ ; Syncope '[ ]'$ ; Presyncope '[ ]'$ ; Paroxysmal nocturnal dyspnea'[ ]'$   Pulmonary: Cough '[ ]'$ ; Wheezing'[ ]'$ ; Hemoptysis'[ ]'$ ; Sputum '[ ]'$ ; Snoring '[ ]'$   GI: Vomiting'[ ]'$ ;  Dysphagia'[ ]'$ ; Melena'[ ]'$ ; Hematochezia '[ ]'$ ; Heartburn'[ ]'$ ; Abdominal pain '[ ]'$ ; Constipation '[ ]'$ ; Diarrhea '[ ]'$ ; BRBPR '[ ]'$   GU: Hematuria'[ ]'$ ; Dysuria '[ ]'$ ; Nocturia'[ ]'$   Vascular: Pain in legs with walking '[ ]'$ ; Pain in feet with lying flat '[ ]'$ ; Non-healing sores '[ ]'$ ; Stroke '[ ]'$ ; TIA '[ ]'$ ; Slurred speech '[ ]'$ ;  Neuro: Headaches'[ ]'$ ; Vertigo'[ ]'$ ; Seizures'[ ]'$ ; Paresthesias'[ ]'$ ;Blurred vision '[ ]'$ ; Diplopia '[ ]'$ ; Vision changes '[ ]'$   Ortho/Skin: Arthritis '[ ]'$ ; Joint pain '[ ]'$ ; Muscle pain '[ ]'$ ; Joint swelling '[ ]'$ ; Back Pain '[ ]'$ ; Rash '[ ]'$   Psych: Depression'[ ]'$ ; Anxiety'[ ]'$   Heme: Bleeding problems '[ ]'$ ; Clotting disorders '[ ]'$ ; Anemia '[ ]'$   Endocrine: Diabetes '[ ]'$ ; Thyroid dysfunction'[ ]'$    Past Medical History:  Diagnosis Date  . Anemia    off and on d/t chemo  . Breast cancer, left breast ([place])   . 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 ado-trastuzumab emtansine (KADCYLA) 100 MG SOLR Inject into the vein every 21 ( twenty-one) days.    Marland Kitchen anastrozole (ARIMIDEX) 1 MG tablet Take 1 mg by mouth daily.    . furosemide (LASIX) 20 MG tablet Take 1 tablet (20 mg total) by mouth daily. 30 tablet 3  . leuprolide (LUPRON) 11.25 MG injection Inject 11.25 mg into the muscle every 21 ( twenty-one) days.    . metoprolol succinate (TOPROL-XL) 25 MG 24 hr tablet Take 1 tablet (25 mg total) by mouth daily. 30 tablet 3  . potassium chloride SA (K-DUR,KLOR-CON) 20 MEQ tablet Take 1 tablet (20 mEq total) by mouth daily. 30 tablet 3  . valsartan (DIOVAN) 40 MG tablet Take 1 tablet (40 mg total) by mouth 2 (two) times daily. 60 tablet 3  . methocarbamol (ROBAXIN) 500 MG tablet Take 1 tablet (500 mg total) by mouth every 8 (eight) hours as needed for muscle spasms. (Patient not taking: Reported on 11/09/2018) 30 tablet 0  . oxyCODONE (OXY IR/ROXICODONE) 5 MG immediate release tablet Take 1-2 tablets (5-10 mg total) by  mouth every 4 (four) hours as needed for moderate pain. (Patient not taking: Reported on 11/09/2018) 40 tablet 0   No current facility-administered medications for this encounter.    Allergies  Allergen Reactions  . Adhesive [Tape] Other (See Comments)    Sensitive to skin Hyperfix (white tape)    Social History   Socioeconomic History  . Marital status: Married    Spouse name: Not on file  . Number of children: Not on file  . Years of education: Not on file  . Highest education level: Not on file  Occupational History  . Not on file  Social Needs  . Financial resource strain: Not on file  . Food insecurity:    Worry: Not on file    Inability: Not on file  . Transportation needs:    Medical: Not on file    Non-medical: Not on file  Tobacco Use  . Smoking status: Never Smoker  . Smokeless tobacco: Never Used  Substance and Sexual Activity  . Alcohol use: Not Currently    Comment: 01/04/2018 'might drink twice/year"  . Drug use: No  . Sexual activity: Yes  Lifestyle  . Physical activity:    Days per week: Not on file    Minutes per session: Not on file  . Stress: Not on file  Relationships  . Social connections:    Talks on phone: Not on file    Gets together: Not on file    Attends religious service: Not on file    Active member of club or organization: Not on file    Attends meetings of clubs or organizations: Not on file    Relationship status: Not on file  . Intimate partner violence:    Fear of current or ex partner: Not on file    Emotionally abused: Not on file    Physically abused: Not on file    Forced sexual activity: Not on file  Other Topics Concern  . Not on file  Social History Narrative  . Not on file    Family History  Problem Relation Age of Onset  . Hypertension Mother   . Heart attack Maternal Grandfather    Vitals:   11/09/18 1039  BP: 130/86  Pulse: 80  SpO2: 98%  Weight: 62.7 kg (138 lb 3.2 oz)   Wt Readings from Last 3  Encounters:  11/09/18 62.7 kg (138 lb 3.2 oz)  09/27/18 62.1 kg (136 lb 12.8 oz)  09/07/18 59 kg (130 lb 1.9 oz)    PHYSICAL EXAM: General:  Well appearing. No respiratory difficulty HEENT: normal Neck: supple. no JVD. Carotids 2+ bilat; no bruits. No lymphadenopathy or thyromegaly appreciated. Cor: PMI nondisplaced. Regular rate & rhythm. No rubs, gallops or murmurs. + prot Lungs: clear Abdomen: soft, nontender, nondistended. No hepatosplenomegaly. No bruits or masses. Good bowel sounds. Extremities: no cyanosis, clubbing, rash, edema Neuro: alert & oriented x 3, cranial nerves grossly intact. moves all 4 extremities w/o difficulty. Affect pleasant.  ASSESSMENT & PLAN:  1.  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 - I do not have access to her initial echoes but by report her EF has certainly dropped since starting chemotherapy - however the culprit is not clear.  - I have reviewed most recent echo from 10/19 and it is a poor study. I disagree with read of 50-55% and think it is more likely in the 40-45% range and I have showed it to Dr. Aundra Dubin who also feels it is around 45% - The incidence of cardiotoxicity with adriamycin is 15% and can happen at anytime (and is often irreversible) - With Kadcyla the incidence of cardiotoxicity is usually 10% but rises to 30% in setting of previous adriamycin exposure. - At this poin,  it does not appear to me that her EF has recovered after holding Kadcyla so this may indeed be adriamycin - If this is adriamycin she may be able to tolerate Kadcyla fine with close monitoring. If this is Kadcyla then we would expect her EF to get worse with further treatment  - We will have her get one more round of Kadcyla and repeat echo. If EF stable or improving will continue. If continues to drop will need to stop  Kadcyla and perhaps consider lapatanib for anti-HER2 therapy (1.6% risk of cardiotoxicity).  - We discussed the need  for close monitoring  for cardiotoxicity but that preventing tumor recurrence was also a major goal here and we would help our Oncology push through with as aggressive therapy as she can tolerate.  - Volume status ok. Will stop lasix.  - Increase valsartan to 80 mg BID    Glori Bickers, MD  7:13 PM

## 2018-12-10 ENCOUNTER — Ambulatory Visit (HOSPITAL_COMMUNITY)
Admission: RE | Admit: 2018-12-10 | Discharge: 2018-12-10 | Disposition: A | Discharge status: Home / Self Care | Payer: Managed Care, Other (non HMO) | Source: Ambulatory Visit | Attending: Internal Medicine | Admitting: Internal Medicine

## 2018-12-10 ENCOUNTER — Ambulatory Visit (HOSPITAL_BASED_OUTPATIENT_CLINIC_OR_DEPARTMENT_OTHER)
Admission: RE | Admit: 2018-12-10 | Discharge: 2018-12-10 | Disposition: A | Discharge status: Home / Self Care | Payer: Managed Care, Other (non HMO) | Source: Ambulatory Visit | Attending: Internal Medicine | Admitting: Internal Medicine

## 2018-12-10 VITALS — BP 130/74 | HR 78 | Wt 138.8 lb

## 2018-12-10 DIAGNOSIS — C50911 Malignant neoplasm of unspecified site of right female breast: Secondary | ICD-10-CM | POA: Diagnosis not present

## 2018-12-10 DIAGNOSIS — Z8249 Family history of ischemic heart disease and other diseases of the circulatory system: Secondary | ICD-10-CM | POA: Diagnosis not present

## 2018-12-10 DIAGNOSIS — T451X5A Adverse effect of antineoplastic and immunosuppressive drugs, initial encounter: Secondary | ICD-10-CM

## 2018-12-10 DIAGNOSIS — C773 Secondary and unspecified malignant neoplasm of axilla and upper limb lymph nodes: Secondary | ICD-10-CM | POA: Insufficient documentation

## 2018-12-10 DIAGNOSIS — I427 Cardiomyopathy due to drug and external agent: Secondary | ICD-10-CM

## 2018-12-10 DIAGNOSIS — Z79899 Other long term (current) drug therapy: Secondary | ICD-10-CM | POA: Insufficient documentation

## 2018-12-10 MED ORDER — CARVEDILOL 3.125 MG PO TABS: 3.1250 mg | ORAL_TABLET | Freq: Two times a day (BID) | ORAL | 3 refills | Status: DC

## 2018-12-10 MED ORDER — PERFLUTREN LIPID MICROSPHERE
1.0000 mL | INTRAVENOUS | Status: DC | PRN
  Administered 2018-12-10: 3 mL via INTRAVENOUS
  Filled 2018-12-10: qty 10

## 2018-12-10 NOTE — Progress Notes (Signed)
  Echocardiogram 2D Echocardiogram has been performed.  

## 2018-12-10 NOTE — Patient Instructions (Signed)
Begin taking Coreg 3.125mg  (1 tab) twice a day.  Your physician has requested that you have a cardiac MRI. Cardiac MRI uses a computer to create images of your heart as its beating, producing both still and moving pictures of your heart and major blood vessels. For further information please visit [email or web address] Please follow the instruction sheet given to you today for more information.  MRI will call you to schedule this appointment.   Your physician recommends that you schedule a follow-up appointment in 2 months.

## 2018-12-10 NOTE — Progress Notes (Signed)
Cardio-Oncology Clinic Consult Note   Referring Physician: Dr. Bettina Gavia Primary Cardiologist: Dr. Bettina Gavia Primary Oncologist: Dr. Marcy Panning [facility])  HPI: Darlene Kline is a 38 y.o. 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 Bettina Gavia to establish in the cardio-oncology clinic for evaluation of possible chemotherapy cardiotoxicity.   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 There is a superficial irregular hypoechoic mass at the 3 o'clock position of the left breast 1 cm from the nipple measuring 0.7 x 1.3 x 1.7 cm located 1.2 cm from the larger mass. Ultrasound of the left axilla demonstrated an abnormal 1 cm node over the region with near complete replacement of the fatty hilum. There was also a little much larger abnormal lymph node higher in the left axilla with cortical thickness of 8 mm.   07/07/2017 Bone scan     No evident bony metastatic disease.    07/17/2017 US Biopsy    Invasive ductal carcinoma with rare foci suspicious for high-grade ductal carcinoma in situ. Biopsy of the left axillary lymph node was consistent with metastatic carcinoma with breast primary. Prognostic panel revealed the tumor to be estrogen receptor 95% positive strong progesterone receptor 10% positive and HER-2/neu by immunohistochemical staining negative, 0 Genetic testing: VUS in CHEK2   01/04/2018 Surgery    Underwent bilateral mastectomy, right nipple sparing with immediate reconstruction on -> Final pathology right benign, left largest focus residual IDC 0.3 cm, +4/13  LN, margins negative. Prognostic panel testing revealed the tumor to be HER-2 positive.   4/4 - 03/28/18 Radiation    February 22, 2018 through Mar 28, 2018. Patient experienced localized skin reaction including desquamation in the upper outer quadrant treated with topical creams.       She denies any h/o known heart disease. Found to have left breast CA in 8/18. Initially HER2-neu negative. Completed initial therapy with Taxotere, Adriamycin, Cyclophosphamide beginning 08/25/17 - 12/08/2017 x 6 cycles. After surgery found to be HER2+ and started on Kadcyla in 6/19. Echo 6/19 reported 55-60%. Echo in 9/19 showed EF 40-45% [facility]). Kadcyla stopped for 3 months. Started on valsartan and Toprol. Had f/u echo in 10/19 read as 50-55%. Since that time Kadcyla restarted and has received 2 cycles. Felt to be in complete remission now.    She returns today for cardio-oncology visit. Last visit, valsartan was increased and lasix was stopped. It was decided that she would get one more dose of Kadcyla and recheck echo today. Overall doing fine. She got Kadcyla 2.5 weeks ago. Denies SOB, orthopnea, or edema. No cough. No CP or dizziness. Taking all medications, but has not yet had valsartan today.  Echo today reads as 25-30% Likely closer to 35%  Echo 09/19/2018 shows 50-55% (Per Dr Haroldine Laws, 40-45%)  MUGA 08/17/18 LVEF 40%, Limited exam because of overlying breast tissue expanders.  Echo 07/24/18 LVEF 40-45% [facility]) Echo 04/23/18 LVEF 55-60%, Mild/Mod MR [facility]) Echo 02/20/18 LVEF 55-60%, GLS -17.7. [facility])  Echo 08/07/17 LVEF "normal"  Review of systems complete and found to be negative  unless listed in HPI.   Past Medical History:  Diagnosis Date  . Anemia    off and on d/t chemo  . Breast cancer, left breast ([place])   . 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  ado-trastuzumab emtansine (KADCYLA) 100 MG SOLR Inject into the vein every 21 ( twenty-one) days.    Marland Kitchen anastrozole (ARIMIDEX) 1 MG tablet Take 1 mg by mouth daily.    Marland Kitchen leuprolide (LUPRON) 11.25 MG injection Inject 11.25 mg into the muscle every 21 ( twenty-one) days.    . Magnesium Oxide 400 MG CAPS Take by mouth.    . metoprolol succinate (TOPROL-XL) 25 MG 24 hr tablet Take 1 tablet (25 mg total) by mouth daily. 30 tablet 3  . potassium chloride SA (K-DUR,KLOR-CON) 20 MEQ tablet Take 1 tablet (20 mEq total) by mouth daily. 30 tablet 3  . traZODone (DESYREL) 50 MG tablet Take by mouth.    . valsartan (DIOVAN) 80 MG tablet Take 1 tablet (80 mg total) by mouth 2 (two) times daily. 60 tablet 6  . zolpidem (AMBIEN CR) 6.25 MG CR tablet Take by mouth.    . furosemide (LASIX) 20 MG tablet Take by mouth.    . oxyCODONE (OXY IR/ROXICODONE) 5 MG immediate release tablet Take 1-2 tablets (5-10 mg total) by mouth every 4 (four) hours as needed for moderate pain. (Patient not taking: Reported on 11/09/2018) 40 tablet 0   No current facility-administered medications for this encounter.    Facility-Administered Medications Ordered in Other Encounters  Medication Dose Route Frequency Provider Last Rate Last Dose  . perflutren lipid microspheres (DEFINITY) IV suspension  1-10 mL Intravenous PRN Tiffay Pinette, Shaune Pascal, MD   3 mL at 12/10/18 1423   Allergies  Allergen Reactions  . Adhesive [Tape] Other (See Comments)    Sensitive to skin Hyperfix (white tape)    Social History   Socioeconomic History  . Marital status: Married    Spouse name: Not on file  . Number of children: Not on file  . Years of education: Not on file  . Highest education level: Not on file  Occupational History  . Not on file  Social Needs  . Financial resource strain: Not on file  . Food insecurity:    Worry: Not on file    Inability: Not on file  . Transportation needs:    Medical: Not on file    Non-medical: Not on file   Tobacco Use  . Smoking status: Never Smoker  . Smokeless tobacco: Never Used  Substance and Sexual Activity  . Alcohol use: Not Currently    Comment: 01/04/2018 'might drink twice/year"  . Drug use: No  . Sexual activity: Yes  Lifestyle  . Physical activity:    Days per week: Not on file    Minutes per session: Not on file  . Stress: Not on file  Relationships  . Social connections:    Talks on phone: Not on file    Gets together: Not on file    Attends religious service: Not on file    Active member of club or organization: Not on file    Attends meetings of clubs or organizations: Not on file    Relationship status: Not on file  . Intimate partner violence:    Fear of current or ex partner: Not on file  Emotionally abused: Not on file    Physically abused: Not on file    Forced sexual activity: Not on file  Other Topics Concern  . Not on file  Social History Narrative  . Not on file    Family History  Problem Relation Age of Onset  . Hypertension Mother   . Heart attack Maternal Grandfather    Vitals:   12/10/18 1418  BP: 130/74  Pulse: 78  SpO2: 99%  Weight: 63 kg (138 lb 12.8 oz)   Wt Readings from Last 3 Encounters:  12/10/18 63 kg (138 lb 12.8 oz)  11/09/18 62.7 kg (138 lb 3.2 oz)  09/27/18 62.1 kg (136 lb 12.8 oz)    PHYSICAL EXAM: General: Well appearing. No resp difficulty. HEENT: Normal Neck: Supple. JVP 5-6. Carotids 2+ bilat; no bruits. No thyromegaly or nodule noted. Cor: PMI nondisplaced. RRR, No M/G/R noted +port Lungs: CTAB, normal effort. Abdomen: Soft, non-tender, non-distended, no HSM. No bruits or masses. +BS  Extremities: No cyanosis, clubbing, or rash. R and LLE no edema.  Neuro: Alert & orientedx3, cranial nerves grossly intact. moves all 4 extremities w/o difficulty. Affect pleasant   ASSESSMENT & PLAN:  1.  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 - I do not have access to her initial  echoes but by report her EF has certainly dropped since starting chemotherapy - however the culprit is not clear.  - Dr Haroldine Laws reviewed echo from 10/19 and it is a poor study. He disagrees with read of 50-55% and think it is more likely in the 40-45%. ~45% per Dr Aundra Dubin. - The incidence of cardiotoxicity with adriamycin is 15% and can happen at anytime (and is often irreversible) - With Kadcyla the incidence of cardiotoxicity is usually 10% but rises to 30% in setting of previous adriamycin exposure. - At this point, it does not appear to me that her EF has recovered after holding Kadcyla so this may indeed be adriamycin - If this is adriamycin she may be able to tolerate Kadcyla fine with close monitoring. If this is Kadcyla then we would expect her EF to get worse with further treatment  - We will have her get one more round of Kadcyla and repeat echo. If EF stable or improving will continue. If continues to drop will need to stop  Kadcyla and perhaps consider lapatanib for anti-HER2 therapy (1.6% risk of cardiotoxicity).  - We discussed the need for close monitoring for cardiotoxicity but that preventing tumor recurrence was also a major goal here and we would help our Oncology push through with as aggressive therapy as she can tolerate.  - Volume status stable.  - Continue valsartan 80 mg BID  - Echo today ~35%  Hold therapy and get cardiac MRI  Georgiana Shore, NP  2:28 PM   She is tolerating Kadcyla well. No symptoms of HF. Echo today reviewed personally. Difficult study. On non-contrast images EF looks to be 40-45% with some regional variability. However with Definity EF looks closer to 30-35%. I reviewed with Dr. Aundra Dubin who agrees. In general Ef stable from last echo. I suspect she has adriamycin toxicity and is tolerating Kadcyla ok. Will proceed with cMRI to more clear evaluate. I d/w Dr. Chancy Milroy by phone. Will hold therapy for now. If Ef 40% or greater continue Kadcyla. If < 40% will stop.  (Her residual HER-2 tumor was quite small). Add carvedilol 3.125 bid.   Glori Bickers, MD  3:34 PM

## 2018-12-29 ENCOUNTER — Other Ambulatory Visit: Payer: Self-pay | Admitting: Cardiology

## 2019-01-02 ENCOUNTER — Other Ambulatory Visit: Payer: Self-pay

## 2019-01-02 ENCOUNTER — Ambulatory Visit: Payer: Managed Care, Other (non HMO) | Attending: General Surgery | Admitting: Rehabilitation

## 2019-01-02 ENCOUNTER — Encounter: Payer: Self-pay | Admitting: Rehabilitation

## 2019-01-02 DIAGNOSIS — Z483 Aftercare following surgery for neoplasm: Secondary | ICD-10-CM | POA: Diagnosis not present

## 2019-01-02 DIAGNOSIS — M25612 Stiffness of left shoulder, not elsewhere classified: Secondary | ICD-10-CM | POA: Insufficient documentation

## 2019-01-02 DIAGNOSIS — L599 Disorder of the skin and subcutaneous tissue related to radiation, unspecified: Secondary | ICD-10-CM | POA: Insufficient documentation

## 2019-01-02 DIAGNOSIS — Z9189 Other specified personal risk factors, not elsewhere classified: Secondary | ICD-10-CM | POA: Insufficient documentation

## 2019-01-02 NOTE — Therapy (Signed)
[place], [place], [address] Phone: [phone]   Fax:  [phone]  Physical Therapy Evaluation  Patient Details  Name: Darlene Kline MRN: [id number] Date of Birth: [birth year] Referring Provider (PT): Dr. Donne Hazel   Encounter Date: 01/02/2019  PT End of Session - 01/02/19 1247    Visit Number  1    Number of Visits  9    Date for PT Re-Evaluation  01/30/19    Authorization - Number of Visits  60    PT Start Time  1107    PT Stop Time  1150    PT Time Calculation (min)  43 min    Activity Tolerance  Patient tolerated treatment well    Behavior During Therapy  [facility] for tasks assessed/performed       Past Medical History:  Diagnosis Date  . Anemia    off and on d/t chemo  . Breast cancer, left breast ([place])   . 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 BREAST BIOPSY Left 07/2018  . CESAREAN SECTION  2013; 2017  . DILATION AND CURETTAGE OF UTERUS  2000  . LATISSIMUS FLAP TO BREAST Left 08/17/2018   Procedure: LEFT LATISSIMUS FLAP;  Surgeon: Irene Limbo, MD;  Location: [place];  Service: Plastics;  Laterality: Left;  Marland Kitchen MASTECTOMY Left 01/04/2018  . MASTECTOMY Right 01/04/2018   PROPHYLACTIC NIPPLE SPARING MASTECTOMY  . MASTECTOMY MODIFIED RADICAL Left 01/04/2018   Procedure: LEFT MASTECTOMY MODIFIED RADICAL;  Surgeon: Rolm Bookbinder, MD;  Location: [place];  Service: General;  Laterality: Left;  . NIPPLE SPARING MASTECTOMY Right 01/04/2018   Procedure: RIGHT PROPHYLACTIC NIPPLE SPARING MASTECTOMY;  Surgeon: Rolm Bookbinder, MD;  Location: [place];  Service: General;  Laterality: Right;  . PORTA CATH INSERTION Right 2018  . RECONSTRUCTION BREAST IMMEDIATE / DELAYED W/ TISSUE EXPANDER Bilateral 01/04/2018    AND ALLODERM  . REMOVAL OF BILATERAL TISSUE EXPANDERS WITH PLACEMENT OF  BILATERAL BREAST IMPLANTS Bilateral 08/17/2018   Procedure: REMOVAL OF BILATERAL TISSUE EXPANDERS WITH PLACEMENT OF BILATERAL SILICONE BREAST IMPLANTS;  Surgeon: Irene Limbo, MD;  Location: [place];  Service: Plastics;  Laterality: Bilateral;    There were no vitals filed for this visit.   Subjective Assessment - 01/02/19 1114    Subjective  ROM in my Lt shoulder has decreased.  I had some PT here and did some exercises and now more cording. I am fearful of lymphedema. Maybe some tingling in the hand when traveling.     Pertinent History  S/P neoadjuvant chemotherapy taxotere, adriamycin, and cyclophosphamide 08/25/17-12/08/17. bilateral mastectomy Rt nipple sparing with immediate reconstruction with expanders on 01/04/18. Expander switched to implants in fall of 2019 by Thimmappa.  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  Kadcyla 02/23/18 to current every 21 days. Radiation 02/22/18-03/28/18 with desquamation.  Ended up having a Lat flap due to skin starting to open and expose the implant. Anastrozole began 04/06/18. Echo showing decreasing EF now at 40%.     Limitations  --   opening jars   Patient Stated Goals  get my ROM back     Currently in Pain?  No/denies         [facility] PT Assessment - 01/02/19 0001      Assessment   Medical Diagnosis  bil mastectomy     Referring Provider (PT)  Dr. Donne Hazel    Onset Date/Surgical Date  01/04/18    Hand Dominance  Right    Next MD Visit  1 year or as needed    Prior Therapy  yes here and [place]      Precautions   Precaution Comments  lymphedema, cancer history, adhesive allergy      Restrictions   Weight Bearing Restrictions  No      Balance Screen   Has the patient fallen in the past 6 months  No    Has the patient had a decrease in activity level because of a fear of falling?   No    Is the patient reluctant to leave their home because of a fear of falling?   No      Home Environment   Living Environment  Private residence     Living Arrangements  Spouse/significant other;Children    Type of Home  House      Prior Function   Level of Independence  Independent    Vocation  Full time employment    Vocation Requirements  [employer] for [employer] wear    Leisure  would like to go back to the gym and lifting but nervous about lymphedema      Cognition   Overall Cognitive Status  Within Functional Limits for tasks assessed      Observation/Other Assessments   Observations  very darkened and tight/shiny skin Lt implant with old scar from emergency lat flap due to breast wound developing    Skin Integrity  very tight lateral and superior implant from radiation and lat flap      Sensation   Additional Comments  reports intermittent tingling and numbness ulnar distribution      Coordination   Gross Motor Movements are Fluid and Coordinated  Yes      Posture/Postural Control   Posture/Postural Control  No significant limitations      ROM / Strength   AROM / PROM / Strength  PROM;AROM      AROM   Overall AROM Comments  with pulling Lt into flexion, abduction, horizontal abduction    AROM Assessment Site  Shoulder    Right/Left Shoulder  Right;Left    Right Shoulder Flexion  165 Degrees    Right Shoulder ABduction  165 Degrees    Right Shoulder Internal Rotation  80 Degrees    Right Shoulder External Rotation  90 Degrees    Right Shoulder Horizontal ABduction  40 Degrees    Left Shoulder Flexion  162 Degrees    Left Shoulder ABduction  155 Degrees    Left Shoulder Internal Rotation  80 Degrees    Left Shoulder External Rotation  90 Degrees    Left Shoulder Horizontal ABduction  40 Degrees      PROM   Overall PROM Comments  end range tightness and scapular lifting with flexion and abduction        LYMPHEDEMA/ONCOLOGY QUESTIONNAIRE - 01/02/19 1136      Type   Cancer Type  left breast with bilateral mastectomies      Surgeries   Mastectomy Date  01/04/18    Saline Implant Reconstruction Date  08/17/18     Axillary Lymph Node Dissection Date  01/04/18    Other Surgery Date  08/17/18   lat flap   Number Lymph Nodes Removed  12   4 pos     Treatment   Active Chemotherapy Treatment  Yes   still getting Kadcyla infusions every  3 weeks on hold now   Past Chemotherapy Treatment  Yes    Active Radiation Treatment  No    Past Radiation Treatment  Yes    Current Hormone Treatment  Yes      What other symptoms do you have   Are you Having Heaviness or Tightness  No    Are you having Pain  No      Lymphedema Assessments   Lymphedema Assessments  Upper extremities      Right Upper Extremity Lymphedema   10 cm Proximal to Olecranon Process  27.8 cm    Olecranon Process  24 cm    10 cm Proximal to Ulnar Styloid Process  19 cm    Just Proximal to Ulnar Styloid Process  14.7 cm    Across Hand at [employer]  18.8 cm    At [place] of 2nd Digit  6.2 cm      Left Upper Extremity Lymphedema   10 cm Proximal to Olecranon Process  27.7 cm    Olecranon Process  24.3 cm    10 cm Proximal to Ulnar Styloid Process  18.7 cm    Just Proximal to Ulnar Styloid Process  14.9 cm    Across Hand at [employer]  19 cm    At [place] of 2nd Digit  6.2 cm          Quick Dash - 01/02/19 0001    Open a tight or new jar  Moderate difficulty    Do heavy household chores (wash walls, wash floors)  No difficulty    Carry a shopping bag or briefcase  No difficulty    Wash your back  Mild difficulty    Use a knife to cut food  No difficulty    Recreational activities in which you take some force or impact through your arm, shoulder, or hand (golf, hammering, tennis)  Moderate difficulty    During the past week, to what extent has your arm, shoulder or hand problem interfered with your normal social activities with family, friends, neighbors, or groups?  Slightly    During the past week, to what extent has your arm, shoulder or hand problem limited your work or other regular daily activities  Slightly    Arm,  shoulder, or hand pain.  Moderate    Tingling (pins and needles) in your arm, shoulder, or hand  Severe    Difficulty Sleeping  No difficulty    DASH Score  27.27 %        Objective measurements completed on examination: See above findings.      [place] Adult PT Treatment/Exercise - 01/02/19 0001      Manual Therapy   Manual Therapy  Soft tissue mobilization;Passive ROM    Soft tissue mobilization  briefly to the Lt pectoralis in stretched position    Passive ROM  tothe Lt shoulder all motions to tolerance end range focus             PT Education - 01/02/19 1247    Education Details  POC    Person(s) Educated  Patient    Methods  Explanation    Comprehension  Verbalized understanding          PT Long Term Goals - 01/02/19 1253      PT LONG TERM GOAL #1   Title  Pt. will be knowledgeable about lymphedema risk reduction practices.    Time  4    Period  Weeks  Status  New    Target Date  01/30/19      PT LONG TERM GOAL #2   Title  Pt. will be independent in strength After Breast Cancer program    Time  4    Period  Weeks    Status  New    Target Date  01/30/19      PT LONG TERM GOAL #3   Title  Pt. will be knowledgeable about safe self-progression of gym exercise and about stretches to do after she works out    Time  4    Period  Weeks    Status  New    Target Date  01/30/19      PT LONG TERM GOAL #4   Title  Pt will report no tingling in the Lt fingers with improved ROM of the shoulder     Time  4    Period  Weeks    Status  New    Target Date  01/30/19      PT LONG TERM GOAL #5   Title  Pt will decrease QDASH to 17% or less    Time  4    Period  Weeks    Status  New    Target Date  01/30/19             Plan - 01/02/19 1248    Clinical Impression Statement  pt returns to PT here today wanting to see if she can improve her Lt shoulder ROM, decrease Lt hand tingling, and learn what to do at the gym and avoid lymphedema.  pt was seen here  before radiation for ROM.  Pt had a poor course of radiation resulting in expander exposure requiring a lat flap to cover the hole.  Very tight, shiny skin over the breast/implant and very darkened from radiation.  No cording evident today but pt reports she has had this in the past.  end range shoulder motion limited with pulling into the breast and latissimus with all motions.  Pt is also fearful of lymphedema and would benefit from transition into exercise and education on this.  Pt currently has one sleeve but would like to get another one.      History and Personal Factors relevant to plan of care:  S/P neoadjuvant chemotherapy taxotere, adriamycin, and cyclophosphamide 08/25/17-12/08/17. bilateral mastectomy Rt nipple sparing with immediate reconstruction with expanders on 01/04/18. Expander switched to implants in fall of 2019 by Thim[facility]. 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 Kadcyla 02/23/18 to current every 21 days. Radiation 02/22/18-03/28/18 with desquamation. Ended up having a Lat flap due to skin starting to open and expose the implant. Anastrozole began 04/06/18. Echo showing decreasing EF now at 40%.    Clinical Presentation  Stable    Clinical Decision Making  Moderate    Rehab Potential  Excellent    PT Frequency  2x / week    PT Duration  4 weeks   pt to call back and schedule due to work    PT Treatment/Interventions  ADLs/Self Care Home Management;Manual techniques;Neuromuscular re-education;Therapeutic exercise;Therapeutic activities;Patient/family education;Manual lymph drainage;Scar mobilization;Passive range of motion    PT Next Visit Plan  script back? begin lt shoulder PROM myofascial release and teach shoulder stretches for HEP , check ULTTs    Recommended Other Services  pt wants to get a second sleeve from a special place.      Consulted and Agree with Plan of Care  Patient  Patient will benefit from skilled therapeutic intervention in order to improve the  following deficits and impairments:  Decreased skin integrity, Decreased knowledge of precautions, Decreased range of motion, Impaired perceived functional ability, Increased fascial restricitons  Visit Diagnosis: Aftercare following surgery for neoplasm  At risk for lymphedema  Stiffness of left shoulder, not elsewhere classified  Disorder of the skin and subcutaneous tissue related to radiation, unspecified     Problem List Patient Active Problem List   Diagnosis Date Noted  . 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 Pregnant 08/23/2018  . History of breast cancer 08/17/2018  . Iron deficiency anemia 07/02/2018  . Anemia 06/18/2018  . History of therapeutic radiation 04/05/2018  . Acquired absence of both breasts 01/11/2018  . Breast cancer, right ([place]) 01/04/2018  . Chemotherapy follow-up examination 09/01/2017  . 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 Abnormal magnetic resonance imaging of right breast 08/09/2017  . GERD (gastroesophageal reflux disease) 08/09/2017  . Vaginal discharge 08/02/2017  . Yeast vaginitis 08/02/2017  . Abnormal mammogram 07/13/2017  . Subareolar mass of left breast 07/04/2017    Shan Levans, PT 01/02/2019, 12:57 PM  [place], [place], [address] Phone: [phone]   Fax:  [phone]  Name: Darlene Kline MRN: [id number] Date of Birth: 1980/12/19

## 2019-01-04 ENCOUNTER — Telehealth (HOSPITAL_COMMUNITY): Payer: Self-pay | Admitting: Emergency Medicine

## 2019-01-04 NOTE — Telephone Encounter (Signed)
Reaching out to patient to remind her of her cardiac MR on 2/18 at 1200 NOON. Pt states she will need to reschedule. Willette Cluster (MR) aware and will reach out to reschedule.   Marchia Bond RN Navigator Cardiac Imaging [facility] Heart [facility] Vascular Services [phone] Office  [phone] Cell

## 2019-01-07 ENCOUNTER — Other Ambulatory Visit: Payer: Self-pay | Admitting: Cardiology

## 2019-01-08 ENCOUNTER — Ambulatory Visit (HOSPITAL_COMMUNITY): Payer: Managed Care, Other (non HMO)

## 2019-01-09 ENCOUNTER — Ambulatory Visit: Payer: Managed Care, Other (non HMO)

## 2019-01-09 DIAGNOSIS — M25612 Stiffness of left shoulder, not elsewhere classified: Secondary | ICD-10-CM

## 2019-01-09 DIAGNOSIS — Z483 Aftercare following surgery for neoplasm: Secondary | ICD-10-CM | POA: Diagnosis not present

## 2019-01-09 DIAGNOSIS — L599 Disorder of the skin and subcutaneous tissue related to radiation, unspecified: Secondary | ICD-10-CM

## 2019-01-09 DIAGNOSIS — Z9189 Other specified personal risk factors, not elsewhere classified: Secondary | ICD-10-CM

## 2019-01-09 NOTE — Therapy (Signed)
[place], [place], [address] Phone: [phone]   Fax:  [phone]  Physical Therapy Treatment  Patient Details  Name: Darlene Kline MRN: [id number] Date of Birth: May 02, 1981 Referring Provider (PT): Dr. Donne Hazel   Encounter Date: 01/09/2019  PT End of Session - 01/09/19 1209    Visit Number  2    Number of Visits  9    Date for PT Re-Evaluation  01/30/19    PT Start Time  1108    PT Stop Time  1153    PT Time Calculation (min)  45 min    Activity Tolerance  Patient tolerated treatment well    Behavior During Therapy  [facility] for tasks assessed/performed       Past Medical History:  Diagnosis Date  . Anemia    off and on d/t chemo  . Breast cancer, left breast ([place])   . 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 BREAST BIOPSY Left 07/2018  . CESAREAN SECTION  2013; 2017  . DILATION AND CURETTAGE OF UTERUS  2000  . LATISSIMUS FLAP TO BREAST Left 08/17/2018   Procedure: LEFT LATISSIMUS FLAP;  Surgeon: Irene Limbo, MD;  Location: [place];  Service: Plastics;  Laterality: Left;  Marland Kitchen MASTECTOMY Left 01/04/2018  . MASTECTOMY Right 01/04/2018   PROPHYLACTIC NIPPLE SPARING MASTECTOMY  . MASTECTOMY MODIFIED RADICAL Left 01/04/2018   Procedure: LEFT MASTECTOMY MODIFIED RADICAL;  Surgeon: Rolm Bookbinder, MD;  Location: [place];  Service: General;  Laterality: Left;  . NIPPLE SPARING MASTECTOMY Right 01/04/2018   Procedure: RIGHT PROPHYLACTIC NIPPLE SPARING MASTECTOMY;  Surgeon: Rolm Bookbinder, MD;  Location: [place];  Service: General;  Laterality: Right;  . PORTA CATH INSERTION Right 2018  . RECONSTRUCTION BREAST IMMEDIATE / DELAYED W/ TISSUE EXPANDER Bilateral 01/04/2018    AND ALLODERM  . REMOVAL OF BILATERAL TISSUE EXPANDERS WITH PLACEMENT OF BILATERAL BREAST IMPLANTS Bilateral 08/17/2018    Procedure: REMOVAL OF BILATERAL TISSUE EXPANDERS WITH PLACEMENT OF BILATERAL SILICONE BREAST IMPLANTS;  Surgeon: Irene Limbo, MD;  Location: [place];  Service: Plastics;  Laterality: Bilateral;    There were no vitals filed for this visit.  Subjective Assessment - 01/09/19 1111    Subjective  The tightness is some better from last week.     Pertinent History  S/P neoadjuvant chemotherapy taxotere, adriamycin, and cyclophosphamide 08/25/17-12/08/17. bilateral mastectomy Rt nipple sparing with immediate reconstruction with expanders on 01/04/18. Expander switched to implants in fall of 2019 by Thimmappa.  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  Kadcyla 02/23/18 to current every 21 days. Radiation 02/22/18-03/28/18 with desquamation.  Ended up having a Lat flap due to skin starting to open and expose the implant. Anastrozole began 04/06/18. Echo showing decreasing EF now at 40%.     Patient Stated Goals  get my ROM back     Currently in Pain?  No/denies                       [facility] Adult PT Treatment/Exercise - 01/09/19 0001      Shoulder Exercises: Pulleys   Flexion  2 minutes    ABduction  2 minutes    ABduction Limitations  Pt returned therapist demo      Shoulder Exercises: Therapy Ball   Flexion  Both;10 reps   forward lean into  end of stretch   ABduction  Left;10 reps   same side lean into end of stretch     Manual Therapy   Manual Therapy  Myofascial release;Passive ROM    Myofascial Release  To Lt axilla and UEpulling througout P/ROM    Passive ROM  In Supine into Lt flexion, abduction, and D2 to pts end ROM             PT Education - 01/09/19 1206    Education Details  Issued ABC handout for HEP and info about ABC class. Also instructed pt how to incorporate stretches into ADLs. Also instructed pt in lymphedema risk reduction and infection practices, also how to safely progress resistance of exs.    Person(s) Educated  Patient    Methods   Explanation;Demonstration;Handout    Comprehension  Verbalized understanding;Returned demonstration          PT Long Term Goals - 01/02/19 1253      PT LONG TERM GOAL #1   Title  Pt. will be knowledgeable about lymphedema risk reduction practices.    Time  4    Period  Weeks    Status  New    Target Date  01/30/19      PT LONG TERM GOAL #2   Title  Pt. will be independent in strength After Breast Cancer program    Time  4    Period  Weeks    Status  New    Target Date  01/30/19      PT LONG TERM GOAL #3   Title  Pt. will be knowledgeable about safe self-progression of gym exercise and about stretches to do after she works out    Time  4    Period  Weeks    Status  New    Target Date  01/30/19      PT LONG TERM GOAL #4   Title  Pt will report no tingling in the Lt fingers with improved ROM of the shoulder     Time  4    Period  Weeks    Status  New    Target Date  01/30/19      PT LONG TERM GOAL #5   Title  Pt will decrease QDASH to 17% or less    Time  4    Period  Weeks    Status  New    Target Date  01/30/19            Plan - 01/09/19 1210    Clinical Impression Statement  Continued with manual therapy to Lt upper quadrant which pt reported felt much looser by end of session. Began instructing her in HEP stretches and how to incorporate stretching into her day. Also educated pt regarding lymphedema risk reduction, infection practices and how to safely progress resistance with exercises answering pts questions throughout, this done during P/ROM.     Rehab Potential  Excellent    PT Frequency  2x / week    PT Duration  4 weeks    PT Treatment/Interventions  ADLs/Self Care Home Management;Manual techniques;Neuromuscular re-education;Therapeutic exercise;Therapeutic activities;Patient/family education;Manual lymph drainage;Scar mobilization;Passive range of motion    PT Next Visit Plan  script back? Cont Lt shoulder PROM, myofascial release and cont teaching  shoulder stretches for HEP , check ULTTs    Recommended Other Services  Pt wants to get measured by [facility] medical; demo sent and she is on scheudle for 3/2 @ 1:00.  Patient will benefit from skilled therapeutic intervention in order to improve the following deficits and impairments:  Decreased skin integrity, Decreased knowledge of precautions, Decreased range of motion, Impaired perceived functional ability, Increased fascial restricitons  Visit Diagnosis: Aftercare following surgery for neoplasm  At risk for lymphedema  Stiffness of left shoulder, not elsewhere classified  Disorder of the skin and subcutaneous tissue related to radiation, unspecified     Problem List Patient Active Problem List   Diagnosis Date Noted  . 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 Pregnant 08/23/2018  . History of breast cancer 08/17/2018  . Iron deficiency anemia 07/02/2018  . Anemia 06/18/2018  . History of therapeutic radiation 04/05/2018  . Acquired absence of both breasts 01/11/2018  . Breast cancer, right ([place]) 01/04/2018  . Chemotherapy follow-up examination 09/01/2017  . 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 Abnormal magnetic resonance imaging of right breast 08/09/2017  . GERD (gastroesophageal reflux disease) 08/09/2017  . Vaginal discharge 08/02/2017  . Yeast vaginitis 08/02/2017  . Abnormal mammogram 07/13/2017  . Subareolar mass of left breast 07/04/2017    Otelia Limes, PTA 01/09/2019, 12:20 PM  [place], [place], [address] Phone: [phone]   Fax:  [phone]  Name: Darlene Kline MRN: [id number] Date of Birth: Mar 03, 1981

## 2019-01-10 ENCOUNTER — Ambulatory Visit: Payer: Managed Care, Other (non HMO) | Admitting: Physical Therapy

## 2019-01-11 ENCOUNTER — Other Ambulatory Visit (HOSPITAL_COMMUNITY): Payer: Self-pay

## 2019-01-11 MED ORDER — CARVEDILOL 3.125 MG PO TABS: 3.1250 mg | ORAL_TABLET | Freq: Two times a day (BID) | ORAL | 3 refills | Status: DC

## 2019-01-14 ENCOUNTER — Ambulatory Visit: Payer: Managed Care, Other (non HMO) | Admitting: Rehabilitation

## 2019-01-14 ENCOUNTER — Encounter: Payer: Self-pay | Admitting: Rehabilitation

## 2019-01-14 DIAGNOSIS — Z483 Aftercare following surgery for neoplasm: Secondary | ICD-10-CM | POA: Diagnosis not present

## 2019-01-14 DIAGNOSIS — M25612 Stiffness of left shoulder, not elsewhere classified: Secondary | ICD-10-CM

## 2019-01-14 DIAGNOSIS — Z9189 Other specified personal risk factors, not elsewhere classified: Secondary | ICD-10-CM

## 2019-01-14 DIAGNOSIS — L599 Disorder of the skin and subcutaneous tissue related to radiation, unspecified: Secondary | ICD-10-CM

## 2019-01-14 NOTE — Therapy (Signed)
[place], [place], [address] Phone: [phone]   Fax:  [phone]  Physical Therapy Treatment  Patient Details  Name: Darlene Kline MRN: [id number] Date of Birth: 07-20-1981 Referring Provider (PT): Dr. Donne Hazel   Encounter Date: 01/14/2019  PT End of Session - 01/14/19 1107    Visit Number  3    Number of Visits  9    Date for PT Re-Evaluation  01/30/19    PT Start Time  2426    PT Stop Time  1100    PT Time Calculation (min)  45 min    Activity Tolerance  Patient tolerated treatment well    Behavior During Therapy  [facility] for tasks assessed/performed       Past Medical History:  Diagnosis Date  . Anemia    off and on d/t chemo  . Breast cancer, left breast ([place])   . 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 BREAST BIOPSY Left 07/2018  . CESAREAN SECTION  2013; 2017  . DILATION AND CURETTAGE OF UTERUS  2000  . LATISSIMUS FLAP TO BREAST Left 08/17/2018   Procedure: LEFT LATISSIMUS FLAP;  Surgeon: Irene Limbo, MD;  Location: [place];  Service: Plastics;  Laterality: Left;  Marland Kitchen MASTECTOMY Left 01/04/2018  . MASTECTOMY Right 01/04/2018   PROPHYLACTIC NIPPLE SPARING MASTECTOMY  . MASTECTOMY MODIFIED RADICAL Left 01/04/2018   Procedure: LEFT MASTECTOMY MODIFIED RADICAL;  Surgeon: Rolm Bookbinder, MD;  Location: [place];  Service: General;  Laterality: Left;  . NIPPLE SPARING MASTECTOMY Right 01/04/2018   Procedure: RIGHT PROPHYLACTIC NIPPLE SPARING MASTECTOMY;  Surgeon: Rolm Bookbinder, MD;  Location: [place];  Service: General;  Laterality: Right;  . PORTA CATH INSERTION Right 2018  . RECONSTRUCTION BREAST IMMEDIATE / DELAYED W/ TISSUE EXPANDER Bilateral 01/04/2018    AND ALLODERM  . REMOVAL OF BILATERAL TISSUE EXPANDERS WITH PLACEMENT OF BILATERAL BREAST IMPLANTS Bilateral 08/17/2018    Procedure: REMOVAL OF BILATERAL TISSUE EXPANDERS WITH PLACEMENT OF BILATERAL SILICONE BREAST IMPLANTS;  Surgeon: Irene Limbo, MD;  Location: [place];  Service: Plastics;  Laterality: Bilateral;    There were no vitals filed for this visit.  Subjective Assessment - 01/14/19 1023    Subjective   I have been doing the stretches at home.      Pertinent History  S/P neoadjuvant chemotherapy taxotere, adriamycin, and cyclophosphamide 08/25/17-12/08/17. bilateral mastectomy Rt nipple sparing with immediate reconstruction with expanders on 01/04/18. Expander switched to implants in fall of 2019 by Thimmappa.  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  Kadcyla 02/23/18 to current every 21 days. Radiation 02/22/18-03/28/18 with desquamation.  Ended up having a Lat flap due to skin starting to open and expose the implant. Anastrozole began 04/06/18. Echo showing decreasing EF now at 40%.     Patient Stated Goals  get my ROM back     Currently in Pain?  No/denies                       [facility] Adult PT Treatment/Exercise - 01/14/19 0001      Exercises   Exercises  Shoulder      Shoulder Exercises: Supine   Protraction  Left;10 reps;Weights    Protraction Weight (lbs)  2    Flexion  Left;10 reps;Weights    Shoulder Flexion Weight (lbs)  2  Other Supine Exercises  supine punch 2# x 10    Other Supine Exercises  circle with straight arm 2# x 10      Manual Therapy   Myofascial Release  to the Lt anterior chest moving slightly into the breast tissue anteriorly and laterally in neutral and in arm stretched positioning    Passive ROM  to the lt shoulder all motions to tolerance                  PT Long Term Goals - 01/02/19 1253      PT LONG TERM GOAL #1   Title  Pt. will be knowledgeable about lymphedema risk reduction practices.    Time  4    Period  Weeks    Status  New    Target Date  01/30/19      PT LONG TERM GOAL #2   Title  Pt. will be independent in  strength After Breast Cancer program    Time  4    Period  Weeks    Status  New    Target Date  01/30/19      PT LONG TERM GOAL #3   Title  Pt. will be knowledgeable about safe self-progression of gym exercise and about stretches to do after she works out    Time  4    Period  Weeks    Status  New    Target Date  01/30/19      PT LONG TERM GOAL #4   Title  Pt will report no tingling in the Lt fingers with improved ROM of the shoulder     Time  4    Period  Weeks    Status  New    Target Date  01/30/19      PT LONG TERM GOAL #5   Title  Pt will decrease QDASH to 17% or less    Time  4    Period  Weeks    Status  New    Target Date  01/30/19            Plan - 01/14/19 1107    Clinical Impression Statement  Pt continues with Lt pectoralis and breast tightness and tension much looser post treatment.  Added some 2# weight exercises supine with pt reporting she was surprised how hard the 2 was    PT Next Visit Plan  script back for Melissa? Cont Lt shoulder PROM, myofascial release and cont teaching shoulder stretches for HEP , check ULTTs?       Patient will benefit from skilled therapeutic intervention in order to improve the following deficits and impairments:  Decreased skin integrity, Decreased knowledge of precautions, Decreased range of motion, Impaired perceived functional ability, Increased fascial restricitons  Visit Diagnosis: Aftercare following surgery for neoplasm  At risk for lymphedema  Stiffness of left shoulder, not elsewhere classified  Disorder of the skin and subcutaneous tissue related to radiation, unspecified     Problem List Patient Active Problem List   Diagnosis Date Noted  . 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 Pregnant 08/23/2018  . History of breast cancer 08/17/2018  . Iron deficiency anemia 07/02/2018  . Anemia 06/18/2018  . History of therapeutic radiation 04/05/2018  . Acquired absence of both breasts  01/11/2018  . Breast cancer, right ([place]) 01/04/2018  . Chemotherapy follow-up examination 09/01/2017  . 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 Abnormal magnetic resonance imaging of right breast 08/09/2017  . GERD (gastroesophageal  reflux disease) 08/09/2017  . Vaginal discharge 08/02/2017  . Yeast vaginitis 08/02/2017  . Abnormal mammogram 07/13/2017  . Subareolar mass of left breast 07/04/2017    Shan Levans, PT 01/14/2019, 11:11 AM  [place], [place], [address] Phone: [phone]   Fax:  [phone]  Name: Darlene Kline MRN: [id number] Date of Birth: [birth year]

## 2019-01-16 ENCOUNTER — Ambulatory Visit: Payer: Managed Care, Other (non HMO) | Admitting: Rehabilitation

## 2019-01-16 ENCOUNTER — Encounter: Payer: Self-pay | Admitting: Rehabilitation

## 2019-01-16 DIAGNOSIS — M25612 Stiffness of left shoulder, not elsewhere classified: Secondary | ICD-10-CM

## 2019-01-16 DIAGNOSIS — Z483 Aftercare following surgery for neoplasm: Secondary | ICD-10-CM

## 2019-01-16 DIAGNOSIS — Z9189 Other specified personal risk factors, not elsewhere classified: Secondary | ICD-10-CM

## 2019-01-16 DIAGNOSIS — L599 Disorder of the skin and subcutaneous tissue related to radiation, unspecified: Secondary | ICD-10-CM

## 2019-01-16 NOTE — Therapy (Signed)
[place], [place], [address] Phone: [phone]   Fax:  [phone]  Physical Therapy Treatment  Patient Details  Name: Darlene Kline MRN: [id number] Date of Birth: [birth year] Referring Provider (PT): Dr. Donne Hazel   Encounter Date: 01/16/2019  PT End of Session - 01/16/19 1656    Visit Number  4    Number of Visits  9    Date for PT Re-Evaluation  01/30/19    PT Start Time  3976    PT Stop Time  1645    PT Time Calculation (min)  36 min    Activity Tolerance  Patient tolerated treatment well    Behavior During Therapy  [facility] for tasks assessed/performed       Past Medical History:  Diagnosis Date  . Anemia    off and on d/t chemo  . Breast cancer, left breast ([place])   . 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 BREAST BIOPSY Left 07/2018  . CESAREAN SECTION  2013; 2017  . DILATION AND CURETTAGE OF UTERUS  2000  . LATISSIMUS FLAP TO BREAST Left 08/17/2018   Procedure: LEFT LATISSIMUS FLAP;  Surgeon: Irene Limbo, MD;  Location: [place];  Service: Plastics;  Laterality: Left;  Marland Kitchen MASTECTOMY Left 01/04/2018  . MASTECTOMY Right 01/04/2018   PROPHYLACTIC NIPPLE SPARING MASTECTOMY  . MASTECTOMY MODIFIED RADICAL Left 01/04/2018   Procedure: LEFT MASTECTOMY MODIFIED RADICAL;  Surgeon: Rolm Bookbinder, MD;  Location: [place];  Service: General;  Laterality: Left;  . NIPPLE SPARING MASTECTOMY Right 01/04/2018   Procedure: RIGHT PROPHYLACTIC NIPPLE SPARING MASTECTOMY;  Surgeon: Rolm Bookbinder, MD;  Location: [place];  Service: General;  Laterality: Right;  . PORTA CATH INSERTION Right 2018  . RECONSTRUCTION BREAST IMMEDIATE / DELAYED W/ TISSUE EXPANDER Bilateral 01/04/2018    AND ALLODERM  . REMOVAL OF BILATERAL TISSUE EXPANDERS WITH PLACEMENT OF BILATERAL BREAST IMPLANTS Bilateral 08/17/2018    Procedure: REMOVAL OF BILATERAL TISSUE EXPANDERS WITH PLACEMENT OF BILATERAL SILICONE BREAST IMPLANTS;  Surgeon: Irene Limbo, MD;  Location: [place];  Service: Plastics;  Laterality: Bilateral;    There were no vitals filed for this visit.  Subjective Assessment - 01/16/19 1608    Subjective  I am feeling good.  Not really sore I can feel it loosening up     Pertinent History  S/P neoadjuvant chemotherapy taxotere, adriamycin, and cyclophosphamide 08/25/17-12/08/17. bilateral mastectomy Rt nipple sparing with immediate reconstruction with expanders on 01/04/18. Expander switched to implants in fall of 2019 by Thimmappa.  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  Kadcyla 02/23/18 to current every 21 days. Radiation 02/22/18-03/28/18 with desquamation.  Ended up having a Lat flap due to skin starting to open and expose the implant. Anastrozole began 04/06/18. Echo showing decreasing EF now at 40%.     Patient Stated Goals  get my ROM back     Currently in Pain?  No/denies                       [facility] Adult PT Treatment/Exercise - 01/16/19 0001      Shoulder Exercises: Supine   Other Supine Exercises  supine on foam roller: pectoralis stretch x 60", alternating overhead x 10 each, protraction/retraction 2# x 10, overhead flexion 2#. Pt given handout on foam roller to purchase if intereseted  Manual Therapy   Myofascial Release  to the Lt anterior chest moving slightly into the breast tissue anteriorly and laterally in neutral and in arm stretched positioning    Passive ROM  to the lt shoulder all motions to tolerance with STM into the pectoralis as stretched                  PT Long Term Goals - 01/02/19 1253      PT LONG TERM GOAL #1   Title  Pt. will be knowledgeable about lymphedema risk reduction practices.    Time  4    Period  Weeks    Status  New    Target Date  01/30/19      PT LONG TERM GOAL #2   Title  Pt. will be independent in strength  After Breast Cancer program    Time  4    Period  Weeks    Status  New    Target Date  01/30/19      PT LONG TERM GOAL #3   Title  Pt. will be knowledgeable about safe self-progression of gym exercise and about stretches to do after she works out    Time  4    Period  Weeks    Status  New    Target Date  01/30/19      PT LONG TERM GOAL #4   Title  Pt will report no tingling in the Lt fingers with improved ROM of the shoulder     Time  4    Period  Weeks    Status  New    Target Date  01/30/19      PT LONG TERM GOAL #5   Title  Pt will decrease QDASH to 17% or less    Time  4    Period  Weeks    Status  New    Target Date  01/30/19            Plan - 01/16/19 1657    Clinical Impression Statement  Pt showing significant improvement today even before starting MT.  Less tension in the pectoralis in supine and more softness to the inferior breast.  Added foam roll stretches today with pt noting sig improvements overall.  AROM as improved with less pulling in the pectoralis region and more so in the latissimus with flexion and abduction    PT Treatment/Interventions  ADLs/Self Care Home Management;Manual techniques;Neuromuscular re-education;Therapeutic exercise;Therapeutic activities;Patient/family education;Manual lymph drainage;Scar mobilization;Passive range of motion    PT Next Visit Plan  Cont Lt shoulder PROM, add supine scap series, myofascial release and cont teaching shoulder stretches for HEP, give foam roll HEP if pt gets one    Consulted and Agree with Plan of Care  Patient       Patient will benefit from skilled therapeutic intervention in order to improve the following deficits and impairments:  Decreased skin integrity, Decreased knowledge of precautions, Decreased range of motion, Impaired perceived functional ability, Increased fascial restricitons  Visit Diagnosis: Aftercare following surgery for neoplasm  At risk for lymphedema  Stiffness of left  shoulder, not elsewhere classified  Disorder of the skin and subcutaneous tissue related to radiation, unspecified     Problem List Patient Active Problem List   Diagnosis Date Noted  . 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 Pregnant 08/23/2018  . History of breast cancer 08/17/2018  . Iron deficiency anemia 07/02/2018  . Anemia 06/18/2018  . History of therapeutic  radiation 04/05/2018  . Acquired absence of both breasts 01/11/2018  . Breast cancer, right ([place]) 01/04/2018  . Chemotherapy follow-up examination 09/01/2017  . 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 Abnormal magnetic resonance imaging of right breast 08/09/2017  . GERD (gastroesophageal reflux disease) 08/09/2017  . Vaginal discharge 08/02/2017  . Yeast vaginitis 08/02/2017  . Abnormal mammogram 07/13/2017  . Subareolar mass of left breast 07/04/2017    Shan Levans, PT 01/16/2019, 5:03 PM  [place], [place], [address] Phone: [phone]   Fax:  [phone]  Name: Darlene Kline MRN: [id number] Date of Birth: [birth year]

## 2019-01-21 ENCOUNTER — Ambulatory Visit: Payer: Managed Care, Other (non HMO) | Attending: General Surgery | Admitting: Rehabilitation

## 2019-01-21 ENCOUNTER — Encounter: Payer: Self-pay | Admitting: Rehabilitation

## 2019-01-21 DIAGNOSIS — M25612 Stiffness of left shoulder, not elsewhere classified: Secondary | ICD-10-CM | POA: Insufficient documentation

## 2019-01-21 DIAGNOSIS — Z9189 Other specified personal risk factors, not elsewhere classified: Secondary | ICD-10-CM | POA: Insufficient documentation

## 2019-01-21 DIAGNOSIS — Z483 Aftercare following surgery for neoplasm: Secondary | ICD-10-CM | POA: Diagnosis not present

## 2019-01-21 DIAGNOSIS — L599 Disorder of the skin and subcutaneous tissue related to radiation, unspecified: Secondary | ICD-10-CM | POA: Insufficient documentation

## 2019-01-21 NOTE — Therapy (Signed)
[place], [place], [address] Phone: [phone]   Fax:  [phone]  Physical Therapy Treatment  Patient Details  Name: Darlene Kline MRN: [id number] Date of Birth: 07-25-1981 Referring Provider (PT): Dr. Donne Hazel   Encounter Date: 01/21/2019  PT End of Session - 01/21/19 1023    Visit Number  5    Number of Visits  9    Date for PT Re-Evaluation  01/30/19    PT Start Time  1018    PT Stop Time  1100    PT Time Calculation (min)  42 min    Activity Tolerance  Patient tolerated treatment well    Behavior During Therapy  [facility] for tasks assessed/performed       Past Medical History:  Diagnosis Date  . Anemia    off and on d/t chemo  . Breast cancer, left breast ([place])   . 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 BREAST BIOPSY Left 07/2018  . CESAREAN SECTION  2013; 2017  . DILATION AND CURETTAGE OF UTERUS  2000  . LATISSIMUS FLAP TO BREAST Left 08/17/2018   Procedure: LEFT LATISSIMUS FLAP;  Surgeon: Irene Limbo, MD;  Location: [place];  Service: Plastics;  Laterality: Left;  Marland Kitchen MASTECTOMY Left 01/04/2018  . MASTECTOMY Right 01/04/2018   PROPHYLACTIC NIPPLE SPARING MASTECTOMY  . MASTECTOMY MODIFIED RADICAL Left 01/04/2018   Procedure: LEFT MASTECTOMY MODIFIED RADICAL;  Surgeon: Rolm Bookbinder, MD;  Location: [place];  Service: General;  Laterality: Left;  . NIPPLE SPARING MASTECTOMY Right 01/04/2018   Procedure: RIGHT PROPHYLACTIC NIPPLE SPARING MASTECTOMY;  Surgeon: Rolm Bookbinder, MD;  Location: [place];  Service: General;  Laterality: Right;  . PORTA CATH INSERTION Right 2018  . RECONSTRUCTION BREAST IMMEDIATE / DELAYED W/ TISSUE EXPANDER Bilateral 01/04/2018    AND ALLODERM  . REMOVAL OF BILATERAL TISSUE EXPANDERS WITH PLACEMENT OF BILATERAL BREAST IMPLANTS Bilateral 08/17/2018   Procedure: REMOVAL OF BILATERAL TISSUE EXPANDERS WITH PLACEMENT OF BILATERAL SILICONE BREAST IMPLANTS;  Surgeon: Irene Limbo, MD;  Location: [place];  Service: Plastics;  Laterality: Bilateral;    There were no vitals filed for this visit.  Subjective Assessment - 01/21/19 1017    Subjective  The shoulder feels good. I got some 3 pound weights    Pertinent History  S/P neoadjuvant chemotherapy taxotere, adriamycin, and cyclophosphamide 08/25/17-12/08/17. bilateral mastectomy Rt nipple sparing with immediate reconstruction with expanders on 01/04/18. Expander switched to implants in fall of 2019 by Thimmappa.  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  Kadcyla 02/23/18 to current every 21 days. Radiation 02/22/18-03/28/18 with desquamation.  Ended up having a Lat flap due to skin starting to open and expose the implant. Anastrozole began 04/06/18. Echo showing decreasing EF now at 40%.     Patient Stated Goals  get my ROM back     Currently in Pain?  No/denies                       [facility] Adult PT Treatment/Exercise - 01/21/19 0001      Shoulder Exercises: Supine   Other Supine Exercises  supine scapular series green band x 10 each with instruction on performance; Lt only for D2 flexion    Other Supine Exercises  education and handout given about foam roll stretches to do but not performed  as pt puchased a foam roll after last session.  Education on how to use [employer] on the phone and pictures printed out.       Shoulder Exercises: Standing   Other Standing Exercises  red band standing flexion and abduction x 10 each, and extension x 10 bilateral with addition to HEP       Shoulder Exercises: Pulleys   Flexion  2 minutes    ABduction  2 minutes      Shoulder Exercises: Therapy Ball   Flexion  Both;10 reps    Flexion Limitations  1# wrist weight      Prosthetics   Prosthetic Care Comments   [employer] measurer came in at end of the appt to measure her for the sleeve.               PT Education - 01/21/19 1101    Education Details  HEP update    Person(s) Educated  Patient    Methods  Explanation;Demonstration;Tactile cues;Verbal cues;Handout    Comprehension  Verbalized understanding;Returned demonstration          PT Long Term Goals - 01/02/19 1253      PT LONG TERM GOAL #1   Title  Pt. will be knowledgeable about lymphedema risk reduction practices.    Time  4    Period  Weeks    Status  New    Target Date  01/30/19      PT LONG TERM GOAL #2   Title  Pt. will be independent in strength After Breast Cancer program    Time  4    Period  Weeks    Status  New    Target Date  01/30/19      PT LONG TERM GOAL #3   Title  Pt. will be knowledgeable about safe self-progression of gym exercise and about stretches to do after she works out    Time  4    Period  Weeks    Status  New    Target Date  01/30/19      PT LONG TERM GOAL #4   Title  Pt will report no tingling in the Lt fingers with improved ROM of the shoulder     Time  4    Period  Weeks    Status  New    Target Date  01/30/19      PT LONG TERM GOAL #5   Title  Pt will decrease QDASH to 17% or less    Time  4    Period  Weeks    Status  New    Target Date  01/30/19            Plan - 01/21/19 1102    Clinical Impression Statement  Focus on updating HEP today with foam roll and supine standing scapular exercises with red and green bands given.  Fitter was able to take pt the last 10 minutes of treatment so she did not have to come back in to get measured.  Pt is tolerating all well    PT Treatment/Interventions  ADLs/Self Care Home Management;Manual techniques;Neuromuscular re-education;Therapeutic exercise;Therapeutic activities;Patient/family education;Manual lymph drainage;Scar mobilization;Passive range of motion    PT Next Visit Plan  Cont Lt shoulder PROM, myofascial release and general strengthening    PT Home Exercise Plan  Access Code: [email or web address]      Consulted and Agree with Plan of Care  Patient       Patient will benefit from skilled  therapeutic intervention in order to improve the following deficits and impairments:  Decreased skin integrity, Decreased knowledge of precautions, Decreased range of motion, Impaired perceived functional ability, Increased fascial restricitons  Visit Diagnosis: Aftercare following surgery for neoplasm  At risk for lymphedema  Stiffness of left shoulder, not elsewhere classified  Disorder of the skin and subcutaneous tissue related to radiation, unspecified     Problem List Patient Active Problem List   Diagnosis Date Noted  . 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 Pregnant 08/23/2018  . History of breast cancer 08/17/2018  . Iron deficiency anemia 07/02/2018  . Anemia 06/18/2018  . History of therapeutic radiation 04/05/2018  . Acquired absence of both breasts 01/11/2018  . Breast cancer, right ([place]) 01/04/2018  . Chemotherapy follow-up examination 09/01/2017  . 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 Abnormal magnetic resonance imaging of right breast 08/09/2017  . GERD (gastroesophageal reflux disease) 08/09/2017  . Vaginal discharge 08/02/2017  . Yeast vaginitis 08/02/2017  . Abnormal mammogram 07/13/2017  . Subareolar mass of left breast 07/04/2017    Shan Levans, PT 01/21/2019, 11:04 AM  [employer] [place], [place], [address] Phone: [phone]   Fax:  [phone]  Name: Darlene Kline MRN: [id number] Date of Birth: August 21, 1981

## 2019-01-21 NOTE — Patient Instructions (Addendum)
Access Code: [email or web address]  URL: [email or web address]  Date: 01/21/2019  Prepared by: Shan Levans   Exercises  Supine on Foam Roll Reach and Roll - 10 reps - 5 seconds hold - 1x daily - 7x weekly  Lovena Neighbours on Foam Roll - 10 reps - 1x daily - 7x weekly  Thoracic Y on Foam Roll - 1 reps - 20-30 seconds hold - 1x daily - 7x weekly  Supine Static Chest Stretch on Foam Roll - 1 reps - 20-30 seconds hold - 1x daily - 7x weekly  Hooklying Scapular Protraction on Foam Roll - 10 reps - 1x daily - 7x weekly  Supine Shoulder Horizontal Abduction with Resistance - 10 reps - 1-3 sets - 1x daily - 7x weekly  Supine Shoulder External Rotation with Resistance - 10 reps - 1-3 sets - 1x daily - 7x weekly  Supine PNF D2 Flexion with Resistance - 10 reps - 1-3 sets - 1x daily - 7x weekly  Standing Shoulder Flexion with Resistance - 10 reps - 1-3 sets - 1x daily - 7x weekly  Standing Single Arm Shoulder Abduction with Resistance - 10 reps - 1-3 sets - 1x daily - 7x weekly  Shoulder extension with resistance - Neutral - 10 reps - 1-3 sets - 1x daily - 7x weekly

## 2019-01-23 ENCOUNTER — Ambulatory Visit: Payer: Managed Care, Other (non HMO)

## 2019-01-28 ENCOUNTER — Encounter: Payer: Self-pay | Admitting: Rehabilitation

## 2019-01-28 ENCOUNTER — Ambulatory Visit: Payer: Managed Care, Other (non HMO) | Admitting: Rehabilitation

## 2019-01-28 DIAGNOSIS — Z483 Aftercare following surgery for neoplasm: Secondary | ICD-10-CM | POA: Diagnosis not present

## 2019-01-28 DIAGNOSIS — Z9189 Other specified personal risk factors, not elsewhere classified: Secondary | ICD-10-CM

## 2019-01-28 DIAGNOSIS — L599 Disorder of the skin and subcutaneous tissue related to radiation, unspecified: Secondary | ICD-10-CM

## 2019-01-28 DIAGNOSIS — M25612 Stiffness of left shoulder, not elsewhere classified: Secondary | ICD-10-CM

## 2019-01-28 NOTE — Therapy (Signed)
[place], [place], [address] Phone: [phone]   Fax:  [phone]  Physical Therapy Treatment  Patient Details  Name: Darlene Kline MRN: [id number] Date of Birth: [birth year] Referring Provider (PT): Dr. Donne Hazel   Encounter Date: 01/28/2019  PT End of Session - 01/28/19 1352    Visit Number  6    Number of Visits  9    Date for PT Re-Evaluation  01/30/19    PT Start Time  1350    PT Stop Time  1430    PT Time Calculation (min)  40 min    Activity Tolerance  Patient tolerated treatment well    Behavior During Therapy  [facility] for tasks assessed/performed       Past Medical History:  Diagnosis Date  . Anemia    off and on d/t chemo  . Breast cancer, left breast ([place])   . 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 BREAST BIOPSY Left 07/2018  . CESAREAN SECTION  2013; 2017  . DILATION AND CURETTAGE OF UTERUS  2000  . LATISSIMUS FLAP TO BREAST Left 08/17/2018   Procedure: LEFT LATISSIMUS FLAP;  Surgeon: Irene Limbo, MD;  Location: [place];  Service: Plastics;  Laterality: Left;  Marland Kitchen MASTECTOMY Left 01/04/2018  . MASTECTOMY Right 01/04/2018   PROPHYLACTIC NIPPLE SPARING MASTECTOMY  . MASTECTOMY MODIFIED RADICAL Left 01/04/2018   Procedure: LEFT MASTECTOMY MODIFIED RADICAL;  Surgeon: Rolm Bookbinder, MD;  Location: [place];  Service: General;  Laterality: Left;  . NIPPLE SPARING MASTECTOMY Right 01/04/2018   Procedure: RIGHT PROPHYLACTIC NIPPLE SPARING MASTECTOMY;  Surgeon: Rolm Bookbinder, MD;  Location: [place];  Service: General;  Laterality: Right;  . PORTA CATH INSERTION Right 2018  . RECONSTRUCTION BREAST IMMEDIATE / DELAYED W/ TISSUE EXPANDER Bilateral 01/04/2018    AND ALLODERM  . REMOVAL OF BILATERAL TISSUE EXPANDERS WITH PLACEMENT OF BILATERAL BREAST IMPLANTS Bilateral 08/17/2018   Procedure: REMOVAL OF BILATERAL TISSUE EXPANDERS WITH PLACEMENT OF BILATERAL SILICONE BREAST IMPLANTS;  Surgeon: Irene Limbo, MD;  Location: [place];  Service: Plastics;  Laterality: Bilateral;    There were no vitals filed for this visit.  Subjective Assessment - 01/28/19 1350    Subjective  Its going well.  I still feel stiffness but its not as noticeable.      Pertinent History  S/P neoadjuvant chemotherapy taxotere, adriamycin, and cyclophosphamide 08/25/17-12/08/17. bilateral mastectomy Rt nipple sparing with immediate reconstruction with expanders on 01/04/18. Expander switched to implants in fall of 2019 by Thim[place].  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  Kadcyla 02/23/18 to current every 21 days. Radiation 02/22/18-03/28/18 with desquamation.  Ended up having a Lat flap due to skin starting to open and expose the implant. Anastrozole began 04/06/18. Echo showing decreasing EF now at 40%.     Patient Stated Goals  get my ROM back     Currently in Pain?  No/denies                       [facility] Adult PT Treatment/Exercise - 01/28/19 0001      Manual Therapy   Soft tissue mobilization  to the Left pectoralis and latissimus and lateral breast in supine and latissimus in sidelying     Myofascial Release  to the Lt anterior chest moving slightly into the breast tissue  anteriorly and laterally in neutral and in arm stretched positioning    Passive ROM  to the lt shoulder all motions to tolerance with STM into the pectoralis as stretched                  PT Long Term Goals - 01/28/19 1438      PT LONG TERM GOAL #1   Title  Pt. will be knowledgeable about lymphedema risk reduction practices.    Status  Achieved      PT LONG TERM GOAL #2   Title  Pt. will be independent in strength After Breast Cancer program    Status  On-going      PT LONG TERM GOAL #3   Title  Pt. will be knowledgeable about safe self-progression of gym exercise and about stretches to  do after she works out    Status  On-going      PT LONG TERM GOAL #4   Title  Pt will report no tingling in the Lt fingers with improved ROM of the shoulder     Status  Achieved      PT LONG TERM GOAL #5   Title  Pt will decrease QDASH to 17% or less    Status  On-going            Plan - 01/28/19 1435    Clinical Impression Statement  Pt is reporting not feeling tightness in the arm unless reaching to extremes of horizontal abduction.  pt loosening up quickly today and with minimal tenderness to palpation.  Discussed decreasing frequency to 1x per week after the next scheduled sessions due to good status     PT Next Visit Plan  Cont Lt shoulder PROM, myofascial release and general strengthening    PT Home Exercise Plan  Access Code: [email or web address]        Patient will benefit from skilled therapeutic intervention in order to improve the following deficits and impairments:     Visit Diagnosis: Aftercare following surgery for neoplasm  At risk for lymphedema  Stiffness of left shoulder, not elsewhere classified  Disorder of the skin and subcutaneous tissue related to radiation, unspecified     Problem List Patient Active Problem List   Diagnosis Date Noted  . 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 Pregnant 08/23/2018  . History of breast cancer 08/17/2018  . Iron deficiency anemia 07/02/2018  . Anemia 06/18/2018  . History of therapeutic radiation 04/05/2018  . Acquired absence of both breasts 01/11/2018  . Breast cancer, right ([place]) 01/04/2018  . Chemotherapy follow-up examination 09/01/2017  . 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 Abnormal magnetic resonance imaging of right breast 08/09/2017  . GERD (gastroesophageal reflux disease) 08/09/2017  . Vaginal discharge 08/02/2017  . Yeast vaginitis 08/02/2017  . Abnormal mammogram 07/13/2017  . Subareolar mass of left breast 07/04/2017    Shan Levans, PT 01/28/2019, 2:40 PM  [place], [place], [address] Phone: [phone]   Fax:  [phone]  Name: Darlene Kline MRN: [id number] Date of Birth: 07/10/1981

## 2019-01-29 ENCOUNTER — Encounter: Payer: Self-pay | Admitting: *Deleted

## 2019-01-30 ENCOUNTER — Ambulatory Visit: Payer: Managed Care, Other (non HMO) | Admitting: Rehabilitation

## 2019-01-30 ENCOUNTER — Encounter: Payer: Self-pay | Admitting: Rehabilitation

## 2019-01-30 ENCOUNTER — Other Ambulatory Visit: Payer: Self-pay

## 2019-01-30 DIAGNOSIS — L599 Disorder of the skin and subcutaneous tissue related to radiation, unspecified: Secondary | ICD-10-CM

## 2019-01-30 DIAGNOSIS — Z483 Aftercare following surgery for neoplasm: Secondary | ICD-10-CM

## 2019-01-30 DIAGNOSIS — M25612 Stiffness of left shoulder, not elsewhere classified: Secondary | ICD-10-CM

## 2019-01-30 DIAGNOSIS — Z9189 Other specified personal risk factors, not elsewhere classified: Secondary | ICD-10-CM

## 2019-01-30 NOTE — Therapy (Signed)
[place], [place], [address] Phone: [phone]   Fax:  [phone]  Physical Therapy Treatment  Patient Details  Name: Darlene Kline MRN: [id number] Date of Birth: [birth year] Referring Provider (PT): Dr. Donne Hazel   Encounter Date: 01/30/2019  PT End of Session - 01/30/19 0847    Visit Number  7    Number of Visits  9    Date for PT Re-Evaluation  01/30/19    PT Start Time  0820    PT Stop Time  0845    PT Time Calculation (min)  25 min    Activity Tolerance  Patient tolerated treatment well    Behavior During Therapy  [facility] for tasks assessed/performed       Past Medical History:  Diagnosis Date  . Anemia    off and on d/t chemo  . Breast cancer, left breast ([place])   . 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 BREAST BIOPSY Left 07/2018  . CESAREAN SECTION  2013; 2017  . DILATION AND CURETTAGE OF UTERUS  2000  . LATISSIMUS FLAP TO BREAST Left 08/17/2018   Procedure: LEFT LATISSIMUS FLAP;  Surgeon: Irene Limbo, MD;  Location: [place];  Service: Plastics;  Laterality: Left;  Marland Kitchen MASTECTOMY Left 01/04/2018  . MASTECTOMY Right 01/04/2018   PROPHYLACTIC NIPPLE SPARING MASTECTOMY  . MASTECTOMY MODIFIED RADICAL Left 01/04/2018   Procedure: LEFT MASTECTOMY MODIFIED RADICAL;  Surgeon: Rolm Bookbinder, MD;  Location: [place];  Service: General;  Laterality: Left;  . NIPPLE SPARING MASTECTOMY Right 01/04/2018   Procedure: RIGHT PROPHYLACTIC NIPPLE SPARING MASTECTOMY;  Surgeon: Rolm Bookbinder, MD;  Location: [place];  Service: General;  Laterality: Right;  . PORTA CATH INSERTION Right 2018  . RECONSTRUCTION BREAST IMMEDIATE / DELAYED W/ TISSUE EXPANDER Bilateral 01/04/2018    AND ALLODERM  . REMOVAL OF BILATERAL TISSUE EXPANDERS WITH PLACEMENT OF BILATERAL BREAST IMPLANTS Bilateral 08/17/2018    Procedure: REMOVAL OF BILATERAL TISSUE EXPANDERS WITH PLACEMENT OF BILATERAL SILICONE BREAST IMPLANTS;  Surgeon: Irene Limbo, MD;  Location: [place];  Service: Plastics;  Laterality: Bilateral;    There were no vitals filed for this visit.  Subjective Assessment - 01/30/19 0846    Subjective  Still feeling much better    Pertinent History  S/P neoadjuvant chemotherapy taxotere, adriamycin, and cyclophosphamide 08/25/17-12/08/17. bilateral mastectomy Rt nipple sparing with immediate reconstruction with expanders on 01/04/18. Expander switched to implants in fall of 2019 by Thim[place].  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  Kadcyla 02/23/18 to current every 21 days. Radiation 02/22/18-03/28/18 with desquamation.  Ended up having a Lat flap due to skin starting to open and expose the implant. Anastrozole began 04/06/18. Echo showing decreasing EF now at 40%.     Currently in Pain?  No/denies                       [facility] Adult PT Treatment/Exercise - 01/30/19 0001      Manual Therapy   Soft tissue mobilization  to the Left pectoralis and latissimus and lateral breast in supine and latissimus in sidelying     Myofascial Release  to the Lt anterior chest moving slightly into the breast tissue anteriorly and laterally in neutral and in arm stretched positioning    Passive ROM  to the lt shoulder all motions  to tolerance with STM into the pectoralis as stretched                  PT Long Term Goals - 01/28/19 1438      PT LONG TERM GOAL #1   Title  Pt. will be knowledgeable about lymphedema risk reduction practices.    Status  Achieved      PT LONG TERM GOAL #2   Title  Pt. will be independent in strength After Breast Cancer program    Status  On-going      PT LONG TERM GOAL #3   Title  Pt. will be knowledgeable about safe self-progression of gym exercise and about stretches to do after she works out    Status  On-going      PT LONG TERM GOAL #4   Title   Pt will report no tingling in the Lt fingers with improved ROM of the shoulder     Status  Achieved      PT LONG TERM GOAL #5   Title  Pt will decrease QDASH to 17% or less    Status  On-going            Plan - 01/30/19 0847    Clinical Impression Statement  pt arrived late today but shoulder has maintained stretched/looser status from last visit and did well with even with a shortened session    PT Next Visit Plan  needs date extension/recert if warranted. Cont Lt shoulder PROM, myofascial release and general strengthening       Patient will benefit from skilled therapeutic intervention in order to improve the following deficits and impairments:     Visit Diagnosis: Aftercare following surgery for neoplasm  At risk for lymphedema  Stiffness of left shoulder, not elsewhere classified  Disorder of the skin and subcutaneous tissue related to radiation, unspecified     Problem List Patient Active Problem List   Diagnosis Date Noted  . 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 Pregnant 08/23/2018  . History of breast cancer 08/17/2018  . Iron deficiency anemia 07/02/2018  . Anemia 06/18/2018  . History of therapeutic radiation 04/05/2018  . Acquired absence of both breasts 01/11/2018  . Breast cancer, right ([place]) 01/04/2018  . Chemotherapy follow-up examination 09/01/2017  . 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 Abnormal magnetic resonance imaging of right breast 08/09/2017  . GERD (gastroesophageal reflux disease) 08/09/2017  . Vaginal discharge 08/02/2017  . Yeast vaginitis 08/02/2017  . Abnormal mammogram 07/13/2017  . Subareolar mass of left breast 07/04/2017    Shan Levans, PT 01/30/2019, 8:49 AM  [place], [place], [address] Phone: [phone]   Fax:  [phone]  Name: Darlene Kline MRN: [id number] Date of Birth: 22-Feb-1981

## 2019-02-01 ENCOUNTER — Encounter: Payer: Managed Care, Other (non HMO) | Admitting: Rehabilitation

## 2019-02-04 ENCOUNTER — Ambulatory Visit: Payer: Managed Care, Other (non HMO) | Admitting: Rehabilitation

## 2019-02-04 ENCOUNTER — Other Ambulatory Visit: Payer: Self-pay

## 2019-02-04 ENCOUNTER — Encounter: Payer: Self-pay | Admitting: Rehabilitation

## 2019-02-04 DIAGNOSIS — M25612 Stiffness of left shoulder, not elsewhere classified: Secondary | ICD-10-CM

## 2019-02-04 DIAGNOSIS — Z483 Aftercare following surgery for neoplasm: Secondary | ICD-10-CM | POA: Diagnosis not present

## 2019-02-04 DIAGNOSIS — Z9189 Other specified personal risk factors, not elsewhere classified: Secondary | ICD-10-CM

## 2019-02-04 DIAGNOSIS — L599 Disorder of the skin and subcutaneous tissue related to radiation, unspecified: Secondary | ICD-10-CM

## 2019-02-04 NOTE — Therapy (Addendum)
[place], [place], [address] Phone: [phone]   Fax:  [phone]  Physical Therapy Treatment  Patient Details  Name: Darlene Kline MRN: [id number] Date of Birth: June 16, 1981 Referring Provider (PT): Dr. Donne Hazel   Encounter Date: 02/04/2019  PT End of Session - 02/04/19 1147    Visit Number  8    Number of Visits  12    Date for PT Re-Evaluation  03/04/19    PT Start Time  1102    PT Stop Time  1145    PT Time Calculation (min)  43 min    Activity Tolerance  Patient tolerated treatment well    Behavior During Therapy  [facility] for tasks assessed/performed       Past Medical History:  Diagnosis Date  . Anemia    off and on d/t chemo  . Breast cancer, left breast ([place])   . 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 BREAST BIOPSY Left 07/2018  . CESAREAN SECTION  2013; 2017  . DILATION AND CURETTAGE OF UTERUS  2000  . LATISSIMUS FLAP TO BREAST Left 08/17/2018   Procedure: LEFT LATISSIMUS FLAP;  Surgeon: Irene Limbo, MD;  Location: [place];  Service: Plastics;  Laterality: Left;  Marland Kitchen MASTECTOMY Left 01/04/2018  . MASTECTOMY Right 01/04/2018   PROPHYLACTIC NIPPLE SPARING MASTECTOMY  . MASTECTOMY MODIFIED RADICAL Left 01/04/2018   Procedure: LEFT MASTECTOMY MODIFIED RADICAL;  Surgeon: Rolm Bookbinder, MD;  Location: [place];  Service: General;  Laterality: Left;  . NIPPLE SPARING MASTECTOMY Right 01/04/2018   Procedure: RIGHT PROPHYLACTIC NIPPLE SPARING MASTECTOMY;  Surgeon: Rolm Bookbinder, MD;  Location: [place];  Service: General;  Laterality: Right;  . PORTA CATH INSERTION Right 2018  . RECONSTRUCTION BREAST IMMEDIATE / DELAYED W/ TISSUE EXPANDER Bilateral 01/04/2018    AND ALLODERM  . REMOVAL OF BILATERAL TISSUE EXPANDERS WITH PLACEMENT OF BILATERAL BREAST IMPLANTS Bilateral 08/17/2018    Procedure: REMOVAL OF BILATERAL TISSUE EXPANDERS WITH PLACEMENT OF BILATERAL SILICONE BREAST IMPLANTS;  Surgeon: Irene Limbo, MD;  Location: [place];  Service: Plastics;  Laterality: Bilateral;    There were no vitals filed for this visit.  Subjective Assessment - 02/04/19 1106    Subjective  I feel so good.      Pertinent History  S/P neoadjuvant chemotherapy taxotere, adriamycin, and cyclophosphamide 08/25/17-12/08/17. bilateral mastectomy Rt nipple sparing with immediate reconstruction with expanders on 01/04/18. Expander switched to implants in fall of 2019 by Thimmappa.  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  Kadcyla 02/23/18 to current every 21 days. Radiation 02/22/18-03/28/18 with desquamation.  Ended up having a Lat flap due to skin starting to open and expose the implant. Anastrozole began 04/06/18. Echo showing decreasing EF now at 40%.     Patient Stated Goals  get my ROM back     Currently in Pain?  No/denies                       [facility] Adult PT Treatment/Exercise - 02/04/19 0001      Shoulder Exercises: Supine   Protraction  Left;10 reps;Weights    Protraction Weight (lbs)  3    External Rotation  Left;10 reps    External Rotation Weight (lbs)  2    External Rotation Limitations  with towel roll    Flexion  Left;10 reps;Weights    Shoulder Flexion Weight (lbs)  3    Diagonals  Left;10 reps;Weights    Diagonals Weight (lbs)  2      Manual Therapy   Soft tissue mobilization  to the Left pectoralis and latissimus and lateral breast in supine and latissimus in sidelying     Myofascial Release  to the Lt anterior chest moving slightly into the breast tissue anteriorly and laterally in neutral and in arm stretched positioning    Passive ROM  to the lt shoulder all motions to tolerance with STM into the pectoralis as stretched                  PT Long Term Goals - 02/04/19 1153      PT LONG TERM GOAL #1   Title  Pt. will be knowledgeable  about lymphedema risk reduction practices.    Status  Achieved      PT LONG TERM GOAL #2   Title  Pt. will be independent in strength After Breast Cancer program    Status  On-going      PT LONG TERM GOAL #3   Title  Pt. will be knowledgeable about safe self-progression of gym exercise and about stretches to do after she works out    Status  On-going      PT LONG TERM GOAL #4   Title  Pt will report no tingling in the Lt fingers with improved ROM of the shoulder     Status  Achieved      PT LONG TERM GOAL #5   Title  Pt will decrease QDASH to 17% or less    Status  On-going      LTGs all achieved per phone call 02/20/19      Plan - 02/04/19 1148    Clinical Impression Statement  Pts shoulder doing very well today.  No pain or pulling with PROM.  Increased weight for resisted movements and decreased one session for this week and pt will check in next week to see if gains are maintained    PT Frequency  1x / week    PT Duration  4 weeks    PT Treatment/Interventions  ADLs/Self Care Home Management;Manual techniques;Neuromuscular re-education;Therapeutic exercise;Therapeutic activities;Patient/family education;Manual lymph drainage;Scar mobilization;Passive range of motion    PT Next Visit Plan  Cont Lt shoulder PROM, myofascial release and general strengthening    PT Home Exercise Plan  Access Code: [email or web address]     Consulted and Agree with Plan of Care  Patient       Patient will benefit from skilled therapeutic intervention in order to improve the following deficits and impairments:  Decreased skin integrity, Decreased knowledge of precautions, Decreased range of motion, Impaired perceived functional ability, Increased fascial restricitons  Visit Diagnosis: Aftercare following surgery for neoplasm  At risk for lymphedema  Disorder of the skin and subcutaneous tissue related to radiation, unspecified  Stiffness of left shoulder, not elsewhere classified     Problem  List Patient Active Problem List   Diagnosis Date Noted  . 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 Pregnant 08/23/2018  . History of breast cancer 08/17/2018  . Iron deficiency anemia 07/02/2018  . Anemia 06/18/2018  . History of therapeutic radiation 04/05/2018  . Acquired absence of both breasts 01/11/2018  . Breast cancer, right ([place]) 01/04/2018  . Chemotherapy follow-up examination 09/01/2017  . 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 Abnormal magnetic resonance imaging of right breast 08/09/2017  .  GERD (gastroesophageal reflux disease) 08/09/2017  . Vaginal discharge 08/02/2017  . Yeast vaginitis 08/02/2017  . Abnormal mammogram 07/13/2017  . Subareolar mass of left breast 07/04/2017    Shan Levans, PT 02/04/2019, 12:04 PM  [place], [place], [address] Phone: [phone]   Fax:  [phone]  Name: MARLAINE AREY MRN: [id number] Date of Birth: [birth year]  PHYSICAL THERAPY DISCHARGE SUMMARY  Visits from Start of Care: 8  Current functional level related to goals / functional outcomes: Due to COVID-19 PT clinic was shut down.  Pt was already doing well and nearing D/C and per final phone call pt reports she is doing well with her HEP at home and will just call if she needs to return in the future.  WIll D/C now   Remaining deficits: Need for continued stretching   Education / Equipment: Final HEP Plan: Patient agrees to discharge.  Patient goals were met. Patient is being discharged due to being pleased with the current functional level.  ?????   Shan Levans, PT

## 2019-02-06 ENCOUNTER — Encounter: Payer: Managed Care, Other (non HMO) | Admitting: Rehabilitation

## 2019-02-08 ENCOUNTER — Ambulatory Visit (HOSPITAL_COMMUNITY): Admission: RE | Admit: 2019-02-08 | Payer: Managed Care, Other (non HMO) | Source: Ambulatory Visit

## 2019-02-12 ENCOUNTER — Ambulatory Visit (HOSPITAL_COMMUNITY)
Admission: RE | Admit: 2019-02-12 | Discharge: 2019-02-12 | Disposition: A | Discharge status: Home / Self Care | Payer: Managed Care, Other (non HMO) | Source: Ambulatory Visit | Attending: Cardiology | Admitting: Cardiology

## 2019-02-12 ENCOUNTER — Other Ambulatory Visit: Payer: Self-pay

## 2019-02-12 DIAGNOSIS — I427 Cardiomyopathy due to drug and external agent: Secondary | ICD-10-CM

## 2019-02-12 DIAGNOSIS — T451X5A Adverse effect of antineoplastic and immunosuppressive drugs, initial encounter: Secondary | ICD-10-CM | POA: Insufficient documentation

## 2019-02-12 MED ORDER — GADOBUTROL 1 MMOL/ML IV SOLN
6.0000 mL | Freq: Once | INTRAVENOUS | Status: AC | PRN
  Administered 2019-02-12: 6 mL via INTRAVENOUS

## 2019-02-13 ENCOUNTER — Telehealth (HOSPITAL_COMMUNITY): Payer: Self-pay | Admitting: Vascular Surgery

## 2019-02-13 NOTE — Telephone Encounter (Signed)
Called pt to move 4/1 echo/f/u w/DB due to Covid-19, pt VM is full will try back later

## 2019-02-15 ENCOUNTER — Ambulatory Visit: Payer: Managed Care, Other (non HMO) | Admitting: Physical Therapy

## 2019-02-20 ENCOUNTER — Ambulatory Visit (HOSPITAL_COMMUNITY): Admission: RE | Admit: 2019-02-20 | Payer: Managed Care, Other (non HMO) | Source: Ambulatory Visit

## 2019-02-20 ENCOUNTER — Encounter (HOSPITAL_COMMUNITY): Payer: Managed Care, Other (non HMO) | Admitting: Internal Medicine

## 2019-02-22 ENCOUNTER — Encounter: Payer: Managed Care, Other (non HMO) | Admitting: Rehabilitation

## 2019-02-27 ENCOUNTER — Ambulatory Visit: Payer: Managed Care, Other (non HMO) | Admitting: Rehabilitation

## 2019-03-06 ENCOUNTER — Encounter: Payer: Managed Care, Other (non HMO) | Admitting: Rehabilitation

## 2019-04-04 ENCOUNTER — Other Ambulatory Visit: Payer: Self-pay | Admitting: Cardiology

## 2019-05-07 ENCOUNTER — Other Ambulatory Visit: Payer: Self-pay | Admitting: Cardiology

## 2019-05-17 ENCOUNTER — Other Ambulatory Visit (HOSPITAL_COMMUNITY): Payer: Self-pay | Admitting: Internal Medicine

## 2019-07-22 IMAGING — MR MR CARDIA MORPHOLOGY WITHOUT AND WITH CONTRAST
13 of 15 series · 38 of 40 positions shown · IV contrast (Gadavist)
Comparison: none

CLINICAL DATA: Chemotherapy cardiotoxicity.

EXAM:
CARDIAC MRI
TECHNIQUE: The patient was scanned on a 1.5 Tesla GE magnet. A dedicated
cardiac coil was used. Functional imaging was done using Fiesta
sequences. [DATE], and 4 chamber views were done to assess for RWMA's.
Modified Amazigh rule using a short axis stack was used to
calculate an ejection fraction on a dedicated work station using
Circle software. The patient received 8 cc of Gadavist. After 10
minutes inversion recovery sequences were used to assess for
infiltration and scar tissue.

[Series 6: bSSFP · sagittal · 8.0mm · 1.43mm/px · 25 of 325 slices shown (1 of 4)]
[im 1/325]
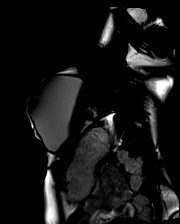
[im 14/325]
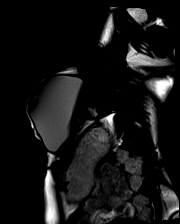
[im 28/325]
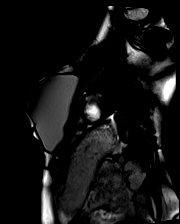
[im 41/325]
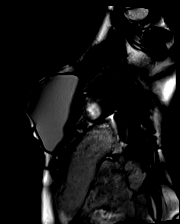
[im 55/325]
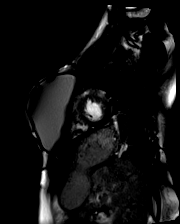
[im 68/325]
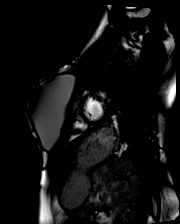
[im 82/325]
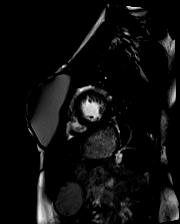
[im 95/325]
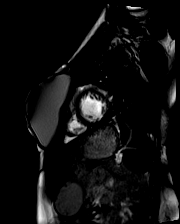
[im 109/325]
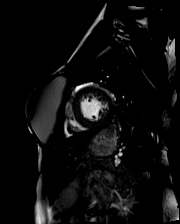
[im 122/325]
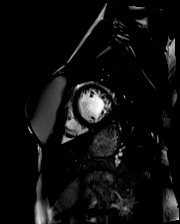
[im 136/325]
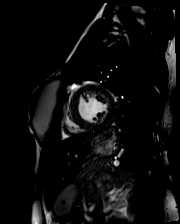
[im 149/325]
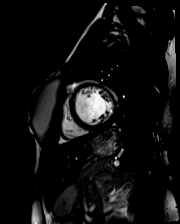
[im 163/325]
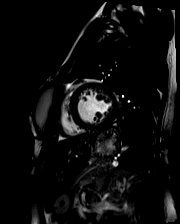
[im 176/325]
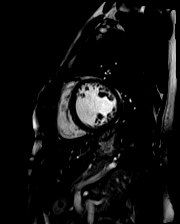
[im 190/325]
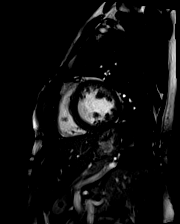
[im 203/325]
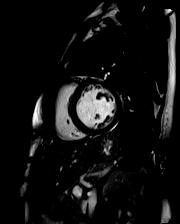
[im 217/325]
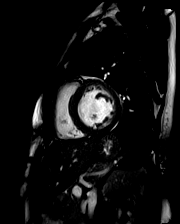
[im 230/325]
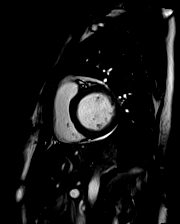
[im 244/325]
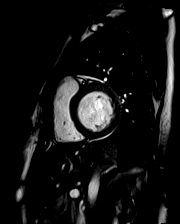
[im 257/325]
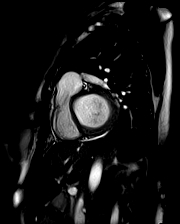
[im 271/325]
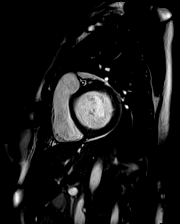
[im 284/325]
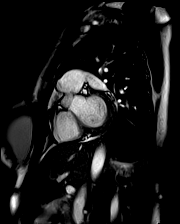
[im 298/325]
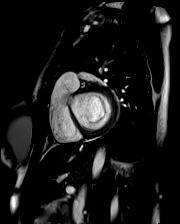
[im 311/325]
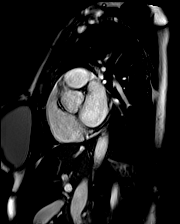
[im 325/325]
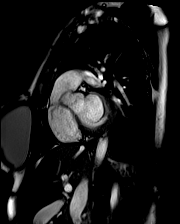

[Series 7: t2_stir_db_sax · sagittal · 8.0mm · 1.73mm/px · 1 of 13 slices shown]
[im 1/13]
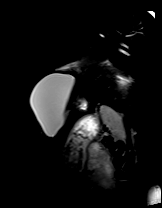

[Series 8: bSSFP · oblique · 6.0mm · 1.25mm/px · 1 of 25 slices shown (2 of 4)]
[im 1/25]
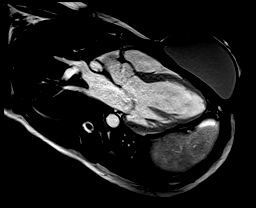

[Series 9: bSSFP · axial · 6.0mm · 1.25mm/px · 1 of 25 slices shown (3 of 4)]
[im 1/25]
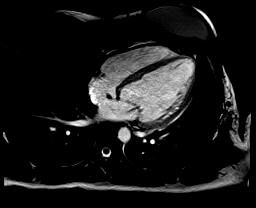

[Series 10: bSSFP · coronal · 6.0mm · 1.25mm/px · 2 of 25 slices shown (4 of 4)]
[im 1/25]
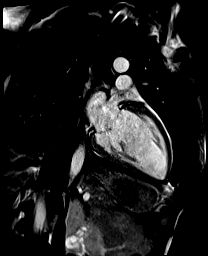
[im 25/25]
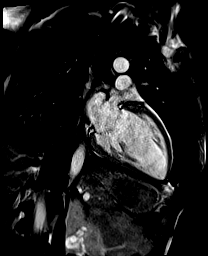

[Series 12: lge_single shot sa · sagittal · 8.0mm · 1.98mm/px · 1 of 13 slices shown (1 of 2)]
[im 1/13]
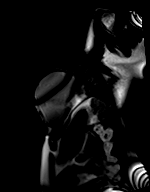

[Series 13: lge_single shot sa · sagittal · 8.0mm · 1.98mm/px · 1 of 13 slices shown (2 of 2)]
[im 1/13]
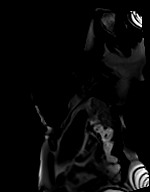

[Series 16: lge_single shot radial_mag · axial · 6.0mm · 1.67mm/px · 1 of 1 slices shown]
[im 1/1]
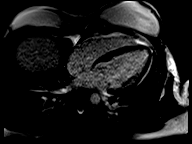

[Series 17: lge_single shot radial_psir · axial · 6.0mm · 1.67mm/px · 1 of 1 slices shown]
[im 1/1]
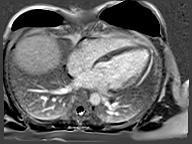

[Series 21: lge short axis_mag · sagittal · 8.0mm · 1.61mm/px · 1 of 13 slices shown]
[im 1/13]
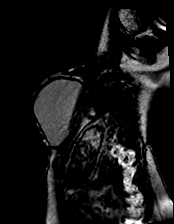

[Series 22: lge short axis_psir · sagittal · 8.0mm · 1.61mm/px · 1 of 13 slices shown]
[im 1/13]
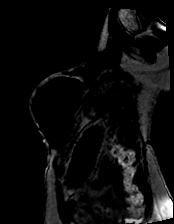

[Series 25: lge radial ((date)ch)_mag · axial · 6.0mm · 1.61mm/px · 1 of 1 slices shown]
[im 1/1]
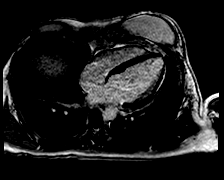

[Series 26: lge radial ((date)ch)_psir · axial · 6.0mm · 1.61mm/px · 1 of 1 slices shown]
[im 1/1]
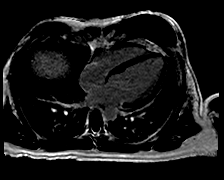

[38 of 40 positions shown; findings below may reference images not displayed]

FINDINGS: Limited images of the lung fields showed no gross abnormalities.

Trivial pericardial effusion. Mildly dilated left ventricle with
normal wall thickness. Diffuse mild hypokinesis, EF 44%. Normal
right ventricular size and systolic function, EF 55%. Normal left
atrial size. Normal right atrial size. Mild mitral regurgitation.
Trileaflet aortic valve without significant stenosis or
regurgitation.

Delayed enhancement images showed no myocardial late gadolinium
enhancement (LGE).

Measurements:
LVEDV 203 mL

LVSV 90 mL

LVEF 44%

RVEDV 137 mL

RVSV 75 mL

RVEF 55%
IMPRESSION: 1. Mildly dilated left ventricle with mild diffuse hypokinesis, EF
44%.

2.  Normal RV size and systolic function, EF 55%.

3. No myocardial LGE, so no definitive evidence for prior MI,
infiltrative disease, or myocarditis.

Fallon Jim

## 2019-08-05 ENCOUNTER — Other Ambulatory Visit: Payer: Self-pay | Admitting: Cardiology

## 2019-09-26 ENCOUNTER — Other Ambulatory Visit (HOSPITAL_COMMUNITY): Payer: Self-pay | Admitting: Internal Medicine

## 2019-09-27 ENCOUNTER — Other Ambulatory Visit: Payer: Self-pay

## 2019-09-27 ENCOUNTER — Encounter (HOSPITAL_BASED_OUTPATIENT_CLINIC_OR_DEPARTMENT_OTHER): Payer: Self-pay | Admitting: *Deleted

## 2019-09-27 NOTE — Progress Notes (Signed)
Dr. Iran Planas called and requested that I have this chart reviewed with the ologist, due to the fact I was having the pt moved to main for an LVEF of 25-30%. Dr. Iran Planas informed me that the pt had had imaging done in March following her 2D ECHO, that showed an increase of her LVEF to 44%. I took this chart to Dr. Nyoka Cowden, our anesthesiologist of the day to review this issue. After reviewing the chart, Dr. Nyoka Cowden felt that because the pt is still on 4 cardiac medications and her EF is not at least 45%, which is this facilities written protocol, this pt should in fact be done at the main hospital. I will call Angie with Dr.Thimmappa's office to confirm.

## 2019-09-27 NOTE — Progress Notes (Signed)
I called and spoke Angie the office coordinator for Dr. Iran Planas to inform her that we per our facility protocol we cannot complete this pts surgery due to her LVEF of 25-30%

## 2019-09-27 NOTE — H&P (Signed)
Subjective:     Patient ID: Katerra Ingman is a 38 y.o. female.    13 months post op implant exchange and left LD flap. Weight up 15 lb since prior to last surgery. Had repeat CT and bone scans 06/2019 without evidence metastatic disease. Still not traveling for work at least through Jan 2021, would like to proceed with fat grafting, nipple reconstruction and removal port.  Presented with palpable left breast mass. MMG revealed a mass in the left retroareolar region with microcalcifications extending from the mass to the base of the nipple spanning 4.8 cm from the posterior border of the mass to the nipple base.US showed an irregular mass at the 530 position of the left breast 2 cmfn measuring 1.8 x 2.9 x 3 cm with  duct extension to the nipple base. An additional mass at 3 o'clock position of the left breast 1 cmfn measuring 0.7 x 1.3 x 1.7 cm located 1.2 cm from the larger mass. [place] of axilla demonstrated at least 2 abnormal LN. Biopsy of 5:30 mass with IDC, ER/PR+, Her2 -. LN biopsy positive for metastatic disease.  invasive ductal carcinoma at the 530 o'clock position. Additional biopsy 2 o clock with UDH.   Genetics with VUS CHEK2. Staging scans negative for distant disease.  Neoadjuvant chemotherapy completed 1.18.19. Final MRI noted indeterminate 5 mm enhancing mass in UIQ RIGHT breast; second look [place] recommended. Near complete resolution of the enhancing masses in the left breast and normal size of the abnormal left axillary LN.  Final pathology right benign, left largest focus residual IDC 0.3 cm, +4/13 LN, margins negative. Completed adjuvant radiation 5.13.19. On anastrazole, Lupron.    Completed Kadcyla 5/20. ECHO 11/2018 EF 25-30% cMRI 01/2019 EF 44%. Dr. Haroldine Laws has counseled no additional testing needed prior to further reconstructive surgery.  Prior 38 C, noted left larger than left and reports right "more like a B." Right mastectomy 351 g, left 538 g  Lives with  husband and two kids. 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      Objective:   Physical Exam  Cardiovascular: Normal rate, regular rhythm and normal heart sounds.  Pulmonary/Chest: Effort normal and breath sounds normal.   Chest:  right chest port, right with rippling superior pole, Baker 1, no lateral displacement in supine position  left chest with areas of hyperpigmentation, lower outer quadrant firm indurated whereas medial left chest soft tissue mobile over implant No animation SN to nipple R 20 cm Nipple to IMF R 8 cm Abd: infra and supraumbilical abdomen with striae redundant skin, small volume donor site flanks Thighs- patient happy with contour thighs and does not want to use as donor site, lateral thighs without striae, medial thighs with some striae present  Assessment:     Left breast ca, metastatic to LN Neoadjuvant chemotherapy S/p Right NSM, left total mastectomy, left ALND, bilateral prepectoral TE/ADM reconstruction (Alloderm) Adjuvant radiation Recurrent left chest fluid collection, threatened ulceration S/p removal bilateral TE, placement silicone implants, left LD flap    Plan:   Plan fat grafting to bilateral chest, removal port, left nipple reconstruction.  Reviewed variable take graft, may need to repeat. Reviewed purpose this to thicken mastectomy flaps, reviewed will not likely change volume breast significantly, and some data to suggest will improve radiation fibrosis changes. Would plan flank donor site. Reviewed central abdomen with striae and liposuction of this area would produce redundant skin. We reviewed again donor sites- she is concerned not  enough fat from just flanks. Reviewed lateral thighs have no striae and has soft tissue pinch- this location would have less risk redundant skin vs medial thighs. However this would alter contour thighs which she has reported she does not want to change. Plan flanks but  may need to place on sides or prone to access all of lower back as donor site. Reviewed post op compression, recommend she purchase Spanx type garment for post op use.  Reviewed options for NAC reconstruction including referral to 3d tattoo artist, tattoo in office, local flap with tattoo or FTSG. Reviewed latter risk is loss projection height reconstructed nipple, may require additional tattoo to FTSG to obtain color match. Reviewed portfolio and examples of each. Patient would like to proceed with nipple reconstruction.   Natrelle Smooth Round Extra Projection implants placed bilateral. RIGHT 470 ml, REF [id number] LEFT 400 ml    Irene Limbo, MD [facility]

## 2019-09-27 NOTE — Progress Notes (Signed)
I went ahead and completed this PAT phone call to make sure pt was still on all cards meds and pt stated that she is. I also called Dr. Iran Planas back and made her aware of what is going on. We will need to follow up on Monday about moving to main. I will need to address this with Tammy the OR RN charge.

## 2019-10-01 ENCOUNTER — Other Ambulatory Visit (HOSPITAL_COMMUNITY)
Admission: RE | Admit: 2019-10-01 | Discharge: 2019-10-01 | Disposition: A | Discharge status: Home / Self Care | Payer: Managed Care, Other (non HMO) | Source: Ambulatory Visit | Attending: Plastic Surgery | Admitting: Plastic Surgery

## 2019-10-01 DIAGNOSIS — Z20828 Contact with and (suspected) exposure to other viral communicable diseases: Secondary | ICD-10-CM | POA: Insufficient documentation

## 2019-10-01 DIAGNOSIS — Z01812 Encounter for preprocedural laboratory examination: Secondary | ICD-10-CM | POA: Diagnosis present

## 2019-10-03 ENCOUNTER — Encounter (HOSPITAL_COMMUNITY): Payer: Self-pay | Admitting: *Deleted

## 2019-10-03 ENCOUNTER — Other Ambulatory Visit (HOSPITAL_COMMUNITY): Payer: Self-pay | Admitting: *Deleted

## 2019-10-03 LAB — NOVEL CORONAVIRUS, NAA (HOSP ORDER, SEND-OUT TO REF LAB; TAT 18-24 HRS): SARS-CoV-2, NAA: NOT DETECTED

## 2019-10-03 MED ORDER — METOPROLOL SUCCINATE ER 25 MG PO TB24: 25.0000 mg | ORAL_TABLET | Freq: Every day | ORAL | 0 refills | Status: DC

## 2019-10-03 NOTE — Anesthesia Preprocedure Evaluation (Addendum)
Anesthesia Evaluation  Patient identified by MRN, date of birth, ID band Patient awake    Reviewed: Allergy & Precautions, NPO status , Patient's Chart, lab work & pertinent test results, reviewed documented beta blocker date and time   History of Anesthesia Complications Negative for: history of anesthetic complications  Airway Mallampati: II  TM Distance: >3 FB Neck ROM: Full    Dental  (+) Dental Advisory Given   Pulmonary neg pulmonary ROS, neg recent URI,    breath sounds clear to auscultation       Cardiovascular hypertension, Pt. on medications and Pt. on home beta blockers +CHF   Rhythm:Regular     Neuro/Psych negative neurological ROS  negative psych ROS   GI/Hepatic Neg liver ROS, GERD  ,  Endo/Other  negative endocrine ROS  Renal/GU negative Renal ROS     Musculoskeletal negative musculoskeletal ROS (+)   Abdominal   Peds  Hematology negative hematology ROS (+)   Anesthesia Other Findings   Reproductive/Obstetrics                                                             Anesthesia Evaluation  Patient identified by MRN, date of birth, ID band Patient awake    Reviewed: Allergy & Precautions, NPO status , Patient's Chart, lab work & pertinent test results  Airway Mallampati: II  TM Distance: >3 FB Neck ROM: Full    Dental no notable dental hx.    Pulmonary neg pulmonary ROS,    Pulmonary exam normal breath sounds clear to auscultation       Cardiovascular hypertension, Normal cardiovascular exam Rhythm:Regular Rate:Normal     Neuro/Psych negative neurological ROS  negative psych ROS   GI/Hepatic negative GI ROS, Neg liver ROS,   Endo/Other  negative endocrine ROS  Renal/GU negative Renal ROS  negative genitourinary   Musculoskeletal negative musculoskeletal ROS (+)   Abdominal   Peds negative pediatric ROS (+)  Hematology  (+)  anemia ,   Anesthesia Other Findings   Reproductive/Obstetrics negative OB ROS                             Anesthesia Physical Anesthesia Plan  ASA: II  Anesthesia Plan: General   Post-op Pain Management:    Induction: Intravenous  PONV Risk Score and Plan: 3 and Ondansetron, Dexamethasone, Scopolamine patch - Pre-op, Midazolam and Treatment may vary due to age or medical condition  Airway Management Planned: LMA and Oral ETT  Additional Equipment:   Intra-op Plan:   Post-operative Plan: Extubation in OR  Informed Consent: I have reviewed the patients History and Physical, chart, labs and discussed the procedure including the risks, benefits and alternatives for the proposed anesthesia with the patient or authorized representative who has indicated his/her understanding and acceptance.   Dental advisory given  Plan Discussed with: CRNA and Surgeon  Anesthesia Plan Comments:         Anesthesia Quick Evaluation  Anesthesia Physical Anesthesia Plan  ASA: II  Anesthesia Plan: General   Post-op Pain Management:    Induction: Intravenous  PONV Risk Score and Plan: 3 and Ondansetron and Dexamethasone  Airway Management Planned: LMA and Oral ETT  Additional Equipment: None  Intra-op Plan:   Post-operative Plan:  Extubation in OR  Informed Consent: I have reviewed the patients History and Physical, chart, labs and discussed the procedure including the risks, benefits and alternatives for the proposed anesthesia with the patient or authorized representative who has indicated his/her understanding and acceptance.     Dental advisory given  Plan Discussed with: CRNA and Surgeon  Anesthesia Plan Comments: (Follows with Dr. Haroldine Laws in the cardio-oncology clinic for possible chemotherapy toxicity. Per last OV note 12/10/18 "No symptoms of HF. Echo today reviewed personally. Difficult study. On non-contrast images EF looks to be 40-45%  with some regional variability. However with Definity EF looks closer to 30-35%. I reviewed with Dr. Aundra Dubin who agrees. In general Ef stable from last echo. I suspect she has adriamycin toxicity and is tolerating Kadcyla ok. Will proceed with cMRI to more clear evaluate. Will hold therapy for now. If Ef 40% or greater continue Kadcyla. If < 40% will stop. (Her residual HER-2 tumor was quite small). Add carvedilol 3.125 bid. " Pt had cardiac MRI 02/12/19 showing EF 44%.  Pt will need DOS labs and eval.  cMRI 02/12/19: IMPRESSION: 1. Mildly dilated left ventricle with mild diffuse hypokinesis, EF 44%.  2.  Normal RV size and systolic function, EF 48%.  3. No myocardial LGE, so no definitive evidence for prior MI, infiltrative disease, or myocarditis.   )       Anesthesia Quick Evaluation

## 2019-10-03 NOTE — Progress Notes (Signed)
Anesthesia Chart Review: Same day workup  Follows with Dr. Haroldine Laws in the cardio-oncology clinic for possible chemotherapy toxicity. Per last OV note 12/10/18 "No symptoms of HF. Echo today reviewed personally. Difficult study. On non-contrast images EF looks to be 40-45% with some regional variability. However with Definity EF looks closer to 30-35%. I reviewed with Dr. Aundra Dubin who agrees. In general Ef stable from last echo. I suspect she has adriamycin toxicity and is tolerating Kadcyla ok. Will proceed with cMRI to more clear evaluate. Will hold therapy for now. If Ef 40% or greater continue Kadcyla. If < 40% will stop. (Her residual HER-2 tumor was quite small). Add carvedilol 3.125 bid. " Pt had cardiac MRI 02/12/19 showing EF 44%.  Pt will need DOS labs and eval.  cMRI 02/12/19: IMPRESSION: 1. Mildly dilated left ventricle with mild diffuse hypokinesis, EF 44%.  2.  Normal RV size and systolic function, EF 36%.  3. No myocardial LGE, so no definitive evidence for prior MI, infiltrative disease, or myocarditis.   Wynonia Musty [facility]/Anesthesiology Phone [phone] 10/03/2019 10:43 AM

## 2019-10-03 NOTE — Progress Notes (Signed)
Mrs Cundiff denies chest pain or shortness of breath. 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

## 2019-10-04 ENCOUNTER — Ambulatory Visit (HOSPITAL_COMMUNITY)
Admission: RE | Admit: 2019-10-04 | Discharge: 2019-10-04 | Disposition: A | Discharge status: Home / Self Care | Payer: Managed Care, Other (non HMO) | Attending: Plastic Surgery | Admitting: Plastic Surgery

## 2019-10-04 ENCOUNTER — Ambulatory Visit (HOSPITAL_COMMUNITY): Payer: Managed Care, Other (non HMO) | Admitting: Anesthesiology

## 2019-10-04 ENCOUNTER — Other Ambulatory Visit: Payer: Self-pay

## 2019-10-04 ENCOUNTER — Encounter (HOSPITAL_COMMUNITY): Payer: Self-pay | Admitting: Anesthesiology

## 2019-10-04 ENCOUNTER — Encounter (HOSPITAL_COMMUNITY)
Admission: RE | Disposition: A | Discharge status: Home / Self Care | Payer: Self-pay | Source: Home / Self Care | Attending: Plastic Surgery

## 2019-10-04 DIAGNOSIS — Z9882 Breast implant status: Secondary | ICD-10-CM | POA: Diagnosis not present

## 2019-10-04 DIAGNOSIS — Z923 Personal history of irradiation: Secondary | ICD-10-CM | POA: Insufficient documentation

## 2019-10-04 DIAGNOSIS — Z421 Encounter for breast reconstruction following mastectomy: Secondary | ICD-10-CM | POA: Insufficient documentation

## 2019-10-04 DIAGNOSIS — Z452 Encounter for adjustment and management of vascular access device: Secondary | ICD-10-CM | POA: Diagnosis not present

## 2019-10-04 DIAGNOSIS — Z9221 Personal history of antineoplastic chemotherapy: Secondary | ICD-10-CM | POA: Insufficient documentation

## 2019-10-04 DIAGNOSIS — Z853 Personal history of malignant neoplasm of breast: Secondary | ICD-10-CM | POA: Insufficient documentation

## 2019-10-04 DIAGNOSIS — Z9013 Acquired absence of bilateral breasts and nipples: Secondary | ICD-10-CM | POA: Insufficient documentation

## 2019-10-04 HISTORY — PX: PORT-A-CATH REMOVAL: SHX5289

## 2019-10-04 HISTORY — PX: LIPOSUCTION WITH LIPOFILLING: SHX6436

## 2019-10-04 HISTORY — PX: BREAST RECONSTRUCTION: SHX9

## 2019-10-04 HISTORY — DX: Heart failure, unspecified: I50.9

## 2019-10-04 LAB — BASIC METABOLIC PANEL
Anion gap: 12 (ref 5–15)
BUN: 17 mg/dL (ref 6–20)
CO2: 24 mmol/L (ref 22–32)
Calcium: 9.6 mg/dL (ref 8.9–10.3)
Chloride: 102 mmol/L (ref 98–111)
Creatinine, Ser: 0.71 mg/dL (ref 0.44–1.00)
GFR calc Af Amer: >60 mL/min (ref >60)
GFR calc non Af Amer: >60 mL/min (ref >60)
Glucose, Bld: 70 mg/dL (ref 70–99)
Potassium: 3.5 mmol/L (ref 3.5–5.1)
Sodium: 138 mmol/L (ref 135–145)

## 2019-10-04 LAB — CBC
HCT: 39.5 % (ref 36.0–46.0)
Hemoglobin: 13.6 g/dL (ref 12.0–15.0)
MCH: 32.2 pg (ref 26.0–34.0)
MCHC: 34.4 g/dL (ref 30.0–36.0)
MCV: 93.4 fL (ref 80.0–100.0)
Platelets: 220 10*3/uL (ref 150–400)
RBC: 4.23 MIL/uL (ref 3.87–5.11)
RDW: 12.1 % (ref 11.5–15.5)
WBC: 5.6 10*3/uL (ref 4.0–10.5)
nRBC: 0 % (ref 0.0–0.2)

## 2019-10-04 LAB — POCT PREGNANCY, URINE: Preg Test, Ur: NEGATIVE

## 2019-10-04 SURGERY — LIPOSUCTION, WITH FAT TRANSFER
Anesthesia: General | Site: Chest | Laterality: Right

## 2019-10-04 MED ORDER — OXYCODONE HCL 5 MG PO TABS: 5.0000 mg | ORAL_TABLET | Freq: Once | ORAL | Status: DC | PRN

## 2019-10-04 MED ORDER — ACETAMINOPHEN 10 MG/ML IV SOLN
INTRAVENOUS | Status: AC
  Filled 2019-10-04: qty 100

## 2019-10-04 MED ORDER — ACETAMINOPHEN 500 MG PO TABS
1000.0000 mg | ORAL_TABLET | ORAL | Status: AC
  Administered 2019-10-04: 11:00:00 1000 mg via ORAL
  Filled 2019-10-04: qty 2

## 2019-10-04 MED ORDER — FENTANYL CITRATE (PF) 250 MCG/5ML IJ SOLN
INTRAMUSCULAR | Status: DC | PRN
  Administered 2019-10-04: 100 ug via INTRAVENOUS
  Administered 2019-10-04 (×2): 50 ug via INTRAVENOUS

## 2019-10-04 MED ORDER — OXYCODONE HCL 5 MG PO TABS
ORAL_TABLET | ORAL | Status: AC
  Filled 2019-10-04: qty 1

## 2019-10-04 MED ORDER — PROPOFOL 10 MG/ML IV BOLUS
INTRAVENOUS | Status: DC | PRN
  Administered 2019-10-04: 140 mg via INTRAVENOUS

## 2019-10-04 MED ORDER — KETAMINE HCL 10 MG/ML IJ SOLN
INTRAMUSCULAR | Status: DC | PRN
  Administered 2019-10-04: 20 mg via INTRAVENOUS
  Administered 2019-10-04: 10 mg via INTRAVENOUS

## 2019-10-04 MED ORDER — LACTATED RINGERS IV SOLN
INTRAVENOUS | Status: DC
  Administered 2019-10-04: 11:00:00 via INTRAVENOUS

## 2019-10-04 MED ORDER — FENTANYL CITRATE (PF) 250 MCG/5ML IJ SOLN
INTRAMUSCULAR | Status: AC
  Filled 2019-10-04: qty 5

## 2019-10-04 MED ORDER — LACTATED RINGERS IR SOLN
Status: DC | PRN
  Administered 2019-10-04 (×2): 1000 mL

## 2019-10-04 MED ORDER — 0.9 % SODIUM CHLORIDE (POUR BTL) OPTIME
TOPICAL | Status: DC | PRN
  Administered 2019-10-04: 1000 mL

## 2019-10-04 MED ORDER — CEFAZOLIN SODIUM-DEXTROSE 2-4 GM/100ML-% IV SOLN
2.0000 g | INTRAVENOUS | Status: DC
  Filled 2019-10-04: qty 100

## 2019-10-04 MED ORDER — DEXAMETHASONE SODIUM PHOSPHATE 10 MG/ML IJ SOLN
INTRAMUSCULAR | Status: DC | PRN
  Administered 2019-10-04: 10 mg via INTRAVENOUS

## 2019-10-04 MED ORDER — CELECOXIB 200 MG PO CAPS
200.0000 mg | ORAL_CAPSULE | ORAL | Status: AC
  Administered 2019-10-04: 200 mg via ORAL
  Filled 2019-10-04: qty 1

## 2019-10-04 MED ORDER — ROCURONIUM BROMIDE 10 MG/ML (PF) SYRINGE
PREFILLED_SYRINGE | INTRAVENOUS | Status: DC | PRN
  Administered 2019-10-04: 60 mg via INTRAVENOUS

## 2019-10-04 MED ORDER — KETAMINE HCL 50 MG/5ML IJ SOSY
PREFILLED_SYRINGE | INTRAMUSCULAR | Status: AC
  Filled 2019-10-04: qty 5

## 2019-10-04 MED ORDER — ACETAMINOPHEN 160 MG/5ML PO SOLN: 1000.0000 mg | Freq: Once | ORAL | Status: DC | PRN

## 2019-10-04 MED ORDER — SUGAMMADEX SODIUM 200 MG/2ML IV SOLN
INTRAVENOUS | Status: DC | PRN
  Administered 2019-10-04: 126.2 mg via INTRAVENOUS

## 2019-10-04 MED ORDER — FENTANYL CITRATE (PF) 100 MCG/2ML IJ SOLN
25.0000 ug | INTRAMUSCULAR | Status: DC | PRN
  Administered 2019-10-04: 50 ug via INTRAVENOUS

## 2019-10-04 MED ORDER — MIDAZOLAM HCL 5 MG/5ML IJ SOLN
INTRAMUSCULAR | Status: DC | PRN
  Administered 2019-10-04: 2 mg via INTRAVENOUS

## 2019-10-04 MED ORDER — LACTATED RINGERS IV SOLN: INTRAVENOUS | Status: DC | PRN

## 2019-10-04 MED ORDER — OXYCODONE HCL 5 MG/5ML PO SOLN: 5.0000 mg | Freq: Once | ORAL | Status: DC | PRN

## 2019-10-04 MED ORDER — LIDOCAINE 2% (20 MG/ML) 5 ML SYRINGE
INTRAMUSCULAR | Status: DC | PRN
  Administered 2019-10-04: 40 mg via INTRAVENOUS

## 2019-10-04 MED ORDER — PHENYLEPHRINE HCL-NACL 10-0.9 MG/250ML-% IV SOLN
INTRAVENOUS | Status: DC | PRN
  Administered 2019-10-04: 10 ug/min via INTRAVENOUS

## 2019-10-04 MED ORDER — MIDAZOLAM HCL 2 MG/2ML IJ SOLN
INTRAMUSCULAR | Status: AC
  Filled 2019-10-04: qty 2

## 2019-10-04 MED ORDER — BACITRACIN ZINC 500 UNIT/GM EX OINT
TOPICAL_OINTMENT | CUTANEOUS | Status: DC | PRN
  Administered 2019-10-04: 1 via TOPICAL

## 2019-10-04 MED ORDER — PROMETHAZINE HCL 25 MG/ML IJ SOLN
6.2500 mg | Freq: Once | INTRAMUSCULAR | Status: AC
  Administered 2019-10-04: 6.25 mg via INTRAVENOUS

## 2019-10-04 MED ORDER — FENTANYL CITRATE (PF) 100 MCG/2ML IJ SOLN
INTRAMUSCULAR | Status: AC
  Filled 2019-10-04: qty 2

## 2019-10-04 MED ORDER — PROMETHAZINE HCL 25 MG/ML IJ SOLN
INTRAMUSCULAR | Status: AC
  Filled 2019-10-04: qty 1

## 2019-10-04 MED ORDER — METHOCARBAMOL 500 MG PO TABS: 500.0000 mg | ORAL_TABLET | Freq: Three times a day (TID) | ORAL | 0 refills | Status: DC | PRN

## 2019-10-04 MED ORDER — ONDANSETRON HCL 4 MG/2ML IJ SOLN
INTRAMUSCULAR | Status: DC | PRN
  Administered 2019-10-04: 4 mg via INTRAVENOUS

## 2019-10-04 MED ORDER — SODIUM BICARBONATE 4 % IV SOLN
Freq: Once | INTRAVENOUS | Status: AC
  Administered 2019-10-04: 1000 mL via INTRAMUSCULAR
  Filled 2019-10-04: qty 50

## 2019-10-04 MED ORDER — OXYCODONE HCL 5 MG PO TABS: 5.0000 mg | ORAL_TABLET | Freq: Four times a day (QID) | ORAL | 0 refills | Status: DC | PRN

## 2019-10-04 MED ORDER — ACETAMINOPHEN 500 MG PO TABS: 1000.0000 mg | ORAL_TABLET | Freq: Once | ORAL | Status: DC | PRN

## 2019-10-04 MED ORDER — ACETAMINOPHEN 10 MG/ML IV SOLN
1000.0000 mg | Freq: Once | INTRAVENOUS | Status: DC | PRN
  Administered 2019-10-04: 1000 mg via INTRAVENOUS

## 2019-10-04 SURGICAL SUPPLY — 59 items
BINDER ABDOMINAL 10 UNV 27-48 (MISCELLANEOUS) ×5 IMPLANT
BINDER ABDOMINAL 12 ML 46-62 (SOFTGOODS) IMPLANT
BINDER BREAST LRG (GAUZE/BANDAGES/DRESSINGS) IMPLANT
BINDER BREAST MEDIUM (GAUZE/BANDAGES/DRESSINGS) IMPLANT
BINDER BREAST XLRG (GAUZE/BANDAGES/DRESSINGS) IMPLANT
BINDER BREAST XXLRG (GAUZE/BANDAGES/DRESSINGS) IMPLANT
BLADE SURG 10 STRL SS (BLADE) ×5 IMPLANT
BLADE SURG 11 STRL SS (BLADE) ×5 IMPLANT
BNDG GAUZE ELAST 4 BULKY (GAUZE/BANDAGES/DRESSINGS) ×10 IMPLANT
CANISTER LIPO FAT HARVEST (MISCELLANEOUS) ×10 IMPLANT
CANISTER SUCT 1200ML W/VALVE (MISCELLANEOUS) ×5 IMPLANT
CHLORAPREP W/TINT 26 (MISCELLANEOUS) ×5 IMPLANT
COVER BACK TABLE 60X90IN (DRAPES) ×5 IMPLANT
COVER MAYO STAND STRL (DRAPES) ×5 IMPLANT
COVER WAND RF STERILE (DRAPES) ×5 IMPLANT
DECANTER SPIKE VIAL GLASS SM (MISCELLANEOUS) IMPLANT
DERMABOND ADVANCED (GAUZE/BANDAGES/DRESSINGS) ×4
DERMABOND ADVANCED .7 DNX12 (GAUZE/BANDAGES/DRESSINGS) ×6 IMPLANT
DRAPE TOP ARMCOVERS (MISCELLANEOUS) ×5 IMPLANT
DRAPE U-SHAPE 76X120 STRL (DRAPES) ×5 IMPLANT
DRSG PAD ABDOMINAL 8X10 ST (GAUZE/BANDAGES/DRESSINGS) ×10 IMPLANT
ELECT COATED BLADE 2.86 ST (ELECTRODE) IMPLANT
ELECT REM PT RETURN 9FT ADLT (ELECTROSURGICAL) ×5
ELECTRODE REM PT RTRN 9FT ADLT (ELECTROSURGICAL) ×3 IMPLANT
FILTER LIPOSUCTION (MISCELLANEOUS) ×5 IMPLANT
GLOVE BIO SURGEON STRL SZ 6 (GLOVE) ×5 IMPLANT
GOWN STRL REUS W/ TWL LRG LVL3 (GOWN DISPOSABLE) ×6 IMPLANT
GOWN STRL REUS W/TWL LRG LVL3 (GOWN DISPOSABLE) ×4
KIT BASIN OR (CUSTOM PROCEDURE TRAY) ×5 IMPLANT
KIT TURNOVER KIT B (KITS) ×5 IMPLANT
LINER CANISTER 1000CC FLEX (MISCELLANEOUS) ×5 IMPLANT
NEEDLE 18GX1X1/2 (RX/OR ONLY) (NEEDLE) ×5 IMPLANT
NS IRRIG 1000ML POUR BTL (IV SOLUTION) ×5 IMPLANT
PAD ALCOHOL SWAB (MISCELLANEOUS) ×5 IMPLANT
PENCIL BUTTON HOLSTER BLD 10FT (ELECTRODE) IMPLANT
SHEET MEDIUM DRAPE 40X70 STRL (DRAPES) ×10 IMPLANT
SLEEVE SCD COMPRESS KNEE MED (MISCELLANEOUS) ×5 IMPLANT
SPONGE LAP 18X18 RF (DISPOSABLE) ×5 IMPLANT
STAPLER VISISTAT 35W (STAPLE) ×5 IMPLANT
SUT MNCRL AB 4-0 PS2 18 (SUTURE) ×5 IMPLANT
SUT MON AB 4-0 PC3 18 (SUTURE) ×5 IMPLANT
SUT PLAIN 5 0 P 3 18 (SUTURE) ×5 IMPLANT
SUT VIC AB 3-0 PS1 18 (SUTURE)
SUT VIC AB 3-0 PS1 18XBRD (SUTURE) IMPLANT
SUT VIC AB 3-0 SH 27 (SUTURE)
SUT VIC AB 3-0 SH 27X BRD (SUTURE) IMPLANT
SUT VICRYL 4-0 PS2 18IN ABS (SUTURE) IMPLANT
SYR 10ML LL (SYRINGE) ×15 IMPLANT
SYR 50ML LL SCALE MARK (SYRINGE) ×20 IMPLANT
SYR BULB IRRIGATION 50ML (SYRINGE) IMPLANT
SYR CONTROL 10ML LL (SYRINGE) IMPLANT
SYR TB 1ML 25GX5/8 (SYRINGE) IMPLANT
TOWEL GREEN STERILE FF (TOWEL DISPOSABLE) ×10 IMPLANT
TUBE CONNECTING 20'X1/4 (TUBING) ×1
TUBE CONNECTING 20X1/4 (TUBING) ×4 IMPLANT
TUBING INFILTRATION IT-10001 (TUBING) ×5 IMPLANT
TUBING SET GRADUATE ASPIR 12FT (MISCELLANEOUS) ×10 IMPLANT
UNDERPAD 30X30 (UNDERPADS AND DIAPERS) ×10 IMPLANT
YANKAUER SUCT BULB TIP NO VENT (SUCTIONS) IMPLANT

## 2019-10-04 NOTE — Anesthesia Postprocedure Evaluation (Signed)
Anesthesia Post Note  Patient: SATORIA TOKUDA  Procedure(s) Performed: LIPOFILLING FROM FLANKS TO BILATERAL CHEST (Bilateral Chest) REMOVAL RIGHT CHEST PORT (Right Chest) LEFT NIPPLE RECONSTRUCTION WITH LOCAL FLAPS (Left Chest)     Patient location during evaluation: PACU Anesthesia Type: General Level of consciousness: awake and alert, oriented and patient cooperative Pain management: pain level controlled Vital Signs Assessment: post-procedure vital signs reviewed and stable Respiratory status: spontaneous breathing, nonlabored ventilation and respiratory function stable Cardiovascular status: blood pressure returned to baseline and stable Postop Assessment: no apparent nausea or vomiting Anesthetic complications: no    Last Vitals:  Vitals:   10/04/19 1349 10/04/19 1405  BP:  (!) 156/93  Pulse: 79 83  Resp: 16 14  Temp: (!) 36.2 C   SpO2: 100% 100%    Last Pain:  Vitals:   10/04/19 1405  TempSrc:   PainSc: 0-No pain                 Pervis Hocking

## 2019-10-04 NOTE — Op Note (Signed)
Operative Note   DATE OF OPERATION: 11.13.20  LOCATION: [facility]  SURGICAL DIVISION: Plastic Surgery  PREOPERATIVE DIAGNOSES:  1. History breast cancer 2. Acquired absence breasts 3. History therapeutic radiation   POSTOPERATIVE DIAGNOSES:  same  PROCEDURE:  1. Removal right chest port 2. Lipofilling bilateral chest 50 ml 3. Left nipple reconstruction with local flaps  SURGEON: Irene Limbo MD MBA  ASSISTANT: none  ANESTHESIA:  General.   EBL: 50 ml  COMPLICATIONS: None immediate.   INDICATIONS FOR PROCEDURE:  The patient, Darlene Kline, is a 38 y.o. female born on 1981/05/31, is here for removal port and additional breast reconstruction following bilateral mastectomies with implant based reconstruction.    FINDINGS: 25 ml fat infiltrated over each chest.  DESCRIPTION OF PROCEDURE:  The patient's operative site was marked with the patient in the preoperative area including bilateral flanks for donor site fat grafting. The location desired nipple reconstruction also marked over left chest reconstruction symmetric with right nipple sparing mastectomy reconstruction. The patient was taken to the operating room. SCDs were placed and IV antibiotics were given. Following induction, patient placed into prone position. The patient's operative site was prepped and draped in a sterile fashion. A time out was performed and all information was confirmed to be correct. Stab incision made over bilateral back and tumescent fluid infiltrated over bilateral flanks, posterior back total 300 ml. Power assisted liposuction completed to endpoint symmetric soft tissue thickness. Port sites closed with simple interrupted 4-0 monocryl. Patient returned to supine position and prepped and draped. Additional time out performed. Incision made in prior right port scar and carried through superficial fascia and capsule. Port removed intact. Closure completed with 4-0 vicryl in superficial fascia  followed by 4-0 vicryl in dermis. Skin closure completed with 4-0 monocryl subcuticular. Dermabond applied.   Stab incision made over upper outer quadrant right and left chest. Fat infiltrated in subcutaneous plane over superior poles bilateral reconstruction, total 50 ml. Incisions closed with simple interrupted 4-0 monocryl.  Laterally based modified Skate flap designed over left chest. Incisions made and flaps elevated in subdermal plane. Flaps inset to each other with 5-0 monocryl in dermis and 5-0 plain gut for skin closure. Donor site closed with 4-0 and 5-0 monocryl in dermis. Skin closure completed with short running 5-0 plain gut. Antibiotic ointment and dry dressing applied.   The patient was allowed to wake from anesthesia, extubated and taken to the recovery room in satisfactory condition.   SPECIMENS: none  DRAINS: none  Irene Limbo, MD [facility]astic [facility]

## 2019-10-04 NOTE — Anesthesia Procedure Notes (Signed)
Procedure Name: Intubation Date/Time: 10/04/2019 11:32 AM Performed by: Renato Shin, CRNA Pre-anesthesia Checklist: Patient identified, Emergency Drugs available, Suction available and Patient being monitored Patient Re-evaluated:Patient Re-evaluated prior to induction Oxygen Delivery Method: Circle system utilized Preoxygenation: Pre-oxygenation with 100% oxygen Induction Type: IV induction Ventilation: Mask ventilation without difficulty Laryngoscope Size: Miller and 2 Grade View: Grade I Tube type: Oral Tube size: 7.0 mm Number of attempts: 1 Airway Equipment and Method: Stylet and Oral airway Placement Confirmation: ETT inserted through vocal cords under direct vision,  positive ETCO2 and breath sounds checked- equal and bilateral Secured at: 21 cm Tube secured with: Tape Dental Injury: Teeth and Oropharynx as per pre-operative assessment

## 2019-10-04 NOTE — Progress Notes (Signed)
Orthopedic Tech Progress Note Patient Details:  Darlene Kline 1980/12/25 [id number]  Ortho Devices Type of Ortho Device: Abdominal binder Ortho Device/Splint Location: or Ortho Device/Splint Interventions: Ordered   Post Interventions Patient Tolerated: Well Instructions Provided: Care of device   Braulio Bosch 10/04/2019, 12:08 PM

## 2019-10-04 NOTE — Interval H&P Note (Signed)
History and Physical Interval Note:  10/04/2019 9:53 AM  Darlene Kline  has presented today for surgery, with the diagnosis of history breast cancer, acquired absence breasts, history therapeutic radiation.  The various methods of treatment have been discussed with the patient and family. After consideration of risks, benefits and other options for treatment, the patient has consented to  Procedure(s): LIPOFILLING FROM FLANKS TO BILATERAL CHEST (Bilateral) REMOVAL RIGHT CHEST PORT (Right) LEFT NIPPLE RECONSTRUCTION WITH LOCAL FLAPS (Left) as a surgical intervention.  The patient's history has been reviewed, patient examined, no change in status, stable for surgery.  I have reviewed the patient's chart and labs.  Questions were answered to the patient's satisfaction.     Arnoldo Hooker Nyhla Mountjoy

## 2019-10-04 NOTE — Transfer of Care (Signed)
Immediate Anesthesia Transfer of Care Note  Patient: Darlene Kline  Procedure(s) Performed: LIPOFILLING FROM FLANKS TO BILATERAL CHEST (Bilateral Chest) REMOVAL RIGHT CHEST PORT (Right Chest) LEFT NIPPLE RECONSTRUCTION WITH LOCAL FLAPS (Left Chest)  Patient Location: PACU  Anesthesia Type:General  Level of Consciousness: awake, drowsy and patient cooperative  Airway & Oxygen Therapy: Patient Spontanous Breathing and Patient connected to nasal cannula oxygen  Post-op Assessment: Report given to RN and Post -op Vital signs reviewed and stable  Post vital signs: Reviewed and stable  Last Vitals:  Vitals Value Taken Time  BP 149/97 10/04/19 1348  Temp    Pulse 75 10/04/19 1350  Resp 16 10/04/19 1350  SpO2 100 % 10/04/19 1350  Vitals shown include unvalidated device data.  Last Pain:  Vitals:   10/04/19 1050  TempSrc:   PainSc: 0-No pain      Patients Stated Pain Goal: 6 ([id number])  Complications: No apparent anesthesia complications

## 2019-10-05 ENCOUNTER — Encounter (HOSPITAL_COMMUNITY): Payer: Self-pay | Admitting: Plastic Surgery

## 2019-10-28 ENCOUNTER — Other Ambulatory Visit (HOSPITAL_COMMUNITY): Payer: Self-pay | Admitting: Internal Medicine

## 2019-10-29 ENCOUNTER — Other Ambulatory Visit: Payer: Self-pay

## 2019-10-29 ENCOUNTER — Ambulatory Visit (HOSPITAL_BASED_OUTPATIENT_CLINIC_OR_DEPARTMENT_OTHER)
Admission: RE | Admit: 2019-10-29 | Discharge: 2019-10-29 | Disposition: A | Discharge status: Home / Self Care | Payer: Managed Care, Other (non HMO) | Source: Ambulatory Visit | Attending: Internal Medicine | Admitting: Internal Medicine

## 2019-10-29 ENCOUNTER — Ambulatory Visit (HOSPITAL_COMMUNITY)
Admission: RE | Admit: 2019-10-29 | Discharge: 2019-10-29 | Disposition: A | Discharge status: Home / Self Care | Payer: Managed Care, Other (non HMO) | Source: Ambulatory Visit | Attending: Internal Medicine | Admitting: Internal Medicine

## 2019-10-29 VITALS — BP 150/98 | HR 85 | Wt 142.6 lb

## 2019-10-29 DIAGNOSIS — I110 Hypertensive heart disease with heart failure: Secondary | ICD-10-CM | POA: Diagnosis not present

## 2019-10-29 DIAGNOSIS — Z7901 Long term (current) use of anticoagulants: Secondary | ICD-10-CM | POA: Diagnosis not present

## 2019-10-29 DIAGNOSIS — T451X5A Adverse effect of antineoplastic and immunosuppressive drugs, initial encounter: Secondary | ICD-10-CM | POA: Diagnosis not present

## 2019-10-29 DIAGNOSIS — I081 Rheumatic disorders of both mitral and tricuspid valves: Secondary | ICD-10-CM | POA: Insufficient documentation

## 2019-10-29 DIAGNOSIS — C50911 Malignant neoplasm of unspecified site of right female breast: Secondary | ICD-10-CM | POA: Diagnosis not present

## 2019-10-29 DIAGNOSIS — I427 Cardiomyopathy due to drug and external agent: Secondary | ICD-10-CM | POA: Diagnosis not present

## 2019-10-29 DIAGNOSIS — Z79899 Other long term (current) drug therapy: Secondary | ICD-10-CM | POA: Diagnosis not present

## 2019-10-29 DIAGNOSIS — C773 Secondary and unspecified malignant neoplasm of axilla and upper limb lymph nodes: Secondary | ICD-10-CM | POA: Diagnosis not present

## 2019-10-29 DIAGNOSIS — I509 Heart failure, unspecified: Secondary | ICD-10-CM | POA: Insufficient documentation

## 2019-10-29 DIAGNOSIS — C50912 Malignant neoplasm of unspecified site of left female breast: Secondary | ICD-10-CM | POA: Diagnosis not present

## 2019-10-29 DIAGNOSIS — R0602 Shortness of breath: Secondary | ICD-10-CM | POA: Insufficient documentation

## 2019-10-29 DIAGNOSIS — I10 Essential (primary) hypertension: Secondary | ICD-10-CM

## 2019-10-29 MED ORDER — ENTRESTO 49-51 MG PO TABS: 1.0000 | ORAL_TABLET | Freq: Two times a day (BID) | ORAL | 3 refills | Status: DC

## 2019-10-29 MED ORDER — CARVEDILOL 6.25 MG PO TABS: 6.2500 mg | ORAL_TABLET | Freq: Two times a day (BID) | ORAL | 3 refills | Status: DC

## 2019-10-29 MED ORDER — POTASSIUM CHLORIDE ER 20 MEQ PO TBCR: 20.0000 meq | EXTENDED_RELEASE_TABLET | Freq: Every day | ORAL | 3 refills | Status: DC

## 2019-10-29 NOTE — Patient Instructions (Signed)
INCREASE Carvedilol to 6.25mg   STOP Valsartan   START Entresto 49/51mg  twice daily  Follow up with PharmD in 1 month  Follow up with Dr.Bensimhon in 4 months (please call our office in January to schedule April appointment)

## 2019-10-29 NOTE — Progress Notes (Signed)
  Echocardiogram 2D Echocardiogram has been performed.  Ahmiya Abee A Karyna Bessler 10/29/2019, 11:03 AM

## 2019-10-29 NOTE — Addendum Note (Signed)
Encounter addended by: Harvie Junior, CMA on: 10/29/2019 12:09 PM  Actions taken: Medication long-term status modified, Order list changed, Clinical Note Signed

## 2019-10-29 NOTE — Progress Notes (Signed)
Cardio-Oncology Clinic Consult Note   Referring Physician: Dr. Bettina Gavia Primary Cardiologist: Dr. Bettina Gavia Primary Oncologist: Dr. Marcy Panning [facility])  HPI: Darlene Kline is a 38 y.o. 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 Bettina Gavia to establish in the cardio-oncology clinic for evaluation of possible chemotherapy cardiotoxicity.   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 There is a superficial irregular hypoechoic mass at the 3 o'clock position of the left breast 1 cm from the nipple measuring 0.7 x 1.3 x 1.7 cm located 1.2 cm from the larger mass. Ultrasound of the left axilla demonstrated an abnormal 1 cm node over the region with near complete replacement of the fatty hilum. There was also a little much larger abnormal lymph node higher in the left axilla with cortical thickness of 8 mm.   07/07/2017 Bone scan     No evident bony metastatic disease.    07/17/2017 US Biopsy    Invasive ductal carcinoma with rare foci suspicious for high-grade ductal carcinoma in situ. Biopsy of the left axillary lymph node was consistent with metastatic carcinoma with breast primary. Prognostic panel revealed the tumor to be estrogen receptor 95% positive strong progesterone receptor 10% positive and HER-2/neu by immunohistochemical staining negative, 0 Genetic testing: VUS in CHEK2   01/04/2018 Surgery    Underwent bilateral mastectomy, right nipple sparing with immediate reconstruction on -> Final pathology right benign, left largest focus residual IDC 0.3 cm, +4/13  LN, margins negative. Prognostic panel testing revealed the tumor to be HER-2 positive.   4/4 - 03/28/18 Radiation    February 22, 2018 through Mar 28, 2018. Patient experienced localized skin reaction including desquamation in the upper outer quadrant treated with topical creams.       She denies any h/o known heart disease. Found to have left breast CA in 8/18. Initially HER2-neu negative. Completed initial therapy with Taxotere, Adriamycin, Cyclophosphamide beginning 08/25/17 - 12/08/2017 x 6 cycles. After surgery found to be HER2+ and started on Kadcyla in 6/19. Echo 6/19 reported 55-60%. Echo in 9/19 showed EF 40-45% [facility]). Kadcyla stopped for 3 months. Started on valsartan and Toprol.  EF subsequently dropped a bit more.   Returns for routine f/u. Has finished Kadcyla got 12/15 doses or so. Felt to be in remission. Getting anti-estrogens only now. Feels good. Active. Exercising.  No edema, orthopnea or PND. Occasional mild SOB with moderate exertion. BP remains high  cMRI 3/20 EF 44% No LGE.  Echo today stable at 40-45%   Echo 1/20 read formally as 25-30% Likely closer to 35-40% Echo 09/19/2018 shows 50-55% (I felt, 40-45%)  MUGA 08/17/18 LVEF 40%, Limited exam because of overlying breast tissue expanders.  Echo 07/24/18 LVEF 40-45% [facility]) Echo 04/23/18 LVEF 55-60%, Mild/Mod MR [facility]) Echo 02/20/18 LVEF 55-60%, GLS -17.7. [facility]) Echo 08/07/17 LVEF "normal"  Review of systems complete and found to be negative unless listed in HPI.   Past Medical History:  Diagnosis Date  . Anemia    off and on d/t chemo  . Breast cancer, left  breast ([place])   . CHF (congestive heart failure) ([place])   . 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 anastrozole (ARIMIDEX) 1 MG tablet Take 1 mg by mouth daily.    . carvedilol (COREG) 3.125 MG tablet TAKE 1 TABLET (3.125 MG  TOTAL) BY MOUTH 2 (TWO) TIMES DAILY WITH A MEAL. 180 tablet 3  . Cobalamin Combinations (VITAMIN B12-FOLIC ACID PO) Take 1 mL by mouth daily. Vitamin B12 1000                      Folic Acid 594 mg    . leuprolide (LUPRON) 11.25 MG injection Inject 11.25 mg into the muscle every 21 ( twenty-one) days.    . Magnesium Oxide 400 MG CAPS Take by mouth.    . methocarbamol (ROBAXIN) 500 MG tablet Take 1 tablet (500 mg total) by mouth every 8 (eight) hours as needed for muscle spasms. 20 tablet 0  . metoprolol succinate (TOPROL-XL) 25 MG 24 hr tablet TAKE 1 TABLET BY MOUTH EVERY DAY 90 tablet 0  . Multiple Vitamin (MULTIVITAMIN) tablet Take 1 tablet by mouth daily.    . Multiple Vitamins-Minerals (ZINC PO) Take 11.25 mg by mouth daily. Liquid    . OVER THE COUNTER MEDICATION Take 1 drop by mouth daily. Harmony Drops    . OVER THE COUNTER MEDICATION Take 5 mLs by mouth daily. Black Seed Oil    . OVER THE COUNTER MEDICATION Take 1 Bag by mouth daily. Kombucha Alternate with probiotic    . oxyCODONE (ROXICODONE) 5 MG immediate release tablet Take 1 tablet (5 mg total) by mouth every 6 (six) hours as needed. 20 tablet 0  . Potassium Chloride ER 20 MEQ TBCR Take 20 mEq by mouth daily.    . Probiotic Product (PROBIOTIC DAILY PO) Take 1 tablet by mouth daily. Alternate with Kombucha drink    . valsartan (DIOVAN) 80 MG tablet TAKE 1 TABLET (80 MG TOTAL) BY MOUTH 2 (TWO) TIMES DAILY. 180 tablet 2   No current facility-administered medications for this encounter.    Allergies  Allergen Reactions  . Adhesive [Tape] Other (See Comments)    Sensitive to skin Hyperfix (white tape)    Social History   Socioeconomic History  . Marital status: Married    Spouse name: Not on file  . Number of children: Not on file  . Years of education: Not on file  . Highest education level: Not on file  Occupational History  . Not on file  Social Needs  . Financial resource strain: Not on file  . Food insecurity     Worry: Not on file    Inability: Not on file  . Transportation needs    Medical: Not on file    Non-medical: Not on file  Tobacco Use  . Smoking status: Never Smoker  . Smokeless tobacco: Never Used  Substance and Sexual Activity  . Alcohol use: Not Currently    Comment: "maybe 2 times a month"  . Drug use: No  . Sexual activity: Yes  Lifestyle  . Physical activity    Days per week: Not on file    Minutes per session: Not on file  . Stress: Not on file  Relationships  . Social Herbalist on phone: Not on file    Gets together: Not on file  Attends religious service: Not on file    Active member of club or organization: Not on file    Attends meetings of clubs or organizations: Not on file    Relationship status: Not on file  . Intimate partner violence    Fear of current or ex partner: Not on file    Emotionally abused: Not on file    Physically abused: Not on file    Forced sexual activity: Not on file  Other Topics Concern  . Not on file  Social History Narrative  . Not on file    Family History  Problem Relation Age of Onset  . Hypertension Mother   . Heart attack Maternal Grandfather    Vitals:   10/29/19 1115  BP: (!) 150/98  Pulse: 85  SpO2: 100%  Weight: 64.7 kg (142 lb 9.6 oz)   Wt Readings from Last 3 Encounters:  10/29/19 64.7 kg (142 lb 9.6 oz)  10/04/19 63 kg (139 lb)  12/10/18 63 kg (138 lb 12.8 oz)    PHYSICAL EXAM: General:  Well appearing. No resp difficulty HEENT: normal Neck: supple. no JVD. Carotids 2+ bilat; no bruits. No lymphadenopathy or thryomegaly appreciated. Cor: PMI nondisplaced. Regular rate & rhythm. No rubs, gallops or murmurs. Lungs: clear Abdomen: soft, nontender, nondistended. No hepatosplenomegaly. No bruits or masses. Good bowel sounds. Extremities: no cyanosis, clubbing, rash, edema Neuro: alert & orientedx3, cranial nerves grossly intact. moves all 4 extremities w/o difficulty. Affect pleasant    ASSESSMENT & PLAN:  1.  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  - now on ant-estrogens - follows with Dr. Humphrey Rolls  2. 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 EF now stable - Will change valsartan to ENTResto 49/51 - Increase carvedilol to 6.25 bid - F/i in PharmD lini in 1 month for further med titration of Entresto +/- adding spiro  3. HTN - BP up. Meds as above   Glori Bickers, MD  11:45 AM

## 2019-12-02 ENCOUNTER — Inpatient Hospital Stay (HOSPITAL_COMMUNITY)
Admission: RE | Admit: 2019-12-02 | Discharge: 2019-12-02 | Disposition: A | Discharge status: Home / Self Care | Payer: Managed Care, Other (non HMO) | Source: Ambulatory Visit

## 2020-03-30 ENCOUNTER — Telehealth (HOSPITAL_COMMUNITY): Payer: Self-pay | Admitting: *Deleted

## 2020-03-30 DIAGNOSIS — I427 Cardiomyopathy due to drug and external agent: Secondary | ICD-10-CM

## 2020-03-30 NOTE — Telephone Encounter (Signed)
Pt called stating she needs to be scheduled for surgery but they wont schedule her until she has a recent echo and cleared by Dr.Bensimhon. Echo appt scheduled for next.

## 2020-04-09 ENCOUNTER — Ambulatory Visit (HOSPITAL_COMMUNITY)
Admission: RE | Admit: 2020-04-09 | Discharge: 2020-04-09 | Disposition: A | Discharge status: Home / Self Care | Payer: Managed Care, Other (non HMO) | Source: Ambulatory Visit | Attending: Cardiology | Admitting: Cardiology

## 2020-04-09 ENCOUNTER — Other Ambulatory Visit: Payer: Self-pay

## 2020-04-09 DIAGNOSIS — Z01818 Encounter for other preprocedural examination: Secondary | ICD-10-CM | POA: Insufficient documentation

## 2020-04-09 DIAGNOSIS — I509 Heart failure, unspecified: Secondary | ICD-10-CM | POA: Insufficient documentation

## 2020-04-09 DIAGNOSIS — C50919 Malignant neoplasm of unspecified site of unspecified female breast: Secondary | ICD-10-CM | POA: Insufficient documentation

## 2020-04-09 DIAGNOSIS — I427 Cardiomyopathy due to drug and external agent: Secondary | ICD-10-CM | POA: Insufficient documentation

## 2020-04-09 DIAGNOSIS — T451X5A Adverse effect of antineoplastic and immunosuppressive drugs, initial encounter: Secondary | ICD-10-CM | POA: Insufficient documentation

## 2020-04-09 DIAGNOSIS — Z9882 Breast implant status: Secondary | ICD-10-CM | POA: Insufficient documentation

## 2020-04-09 NOTE — Progress Notes (Signed)
  Echocardiogram 2D Echocardiogram has been performed.  Darlene Kline 04/09/2020, 3:41 PM

## 2020-04-16 ENCOUNTER — Ambulatory Visit (HOSPITAL_COMMUNITY)
Admission: RE | Admit: 2020-04-16 | Discharge: 2020-04-16 | Disposition: A | Discharge status: Home / Self Care | Payer: Managed Care, Other (non HMO) | Source: Ambulatory Visit | Attending: Cardiology | Admitting: Cardiology

## 2020-04-16 ENCOUNTER — Other Ambulatory Visit: Payer: Self-pay

## 2020-04-16 VITALS — BP 132/70 | HR 79 | Ht 63.0 in | Wt 153.8 lb

## 2020-04-16 DIAGNOSIS — I428 Other cardiomyopathies: Secondary | ICD-10-CM | POA: Diagnosis not present

## 2020-04-16 DIAGNOSIS — C773 Secondary and unspecified malignant neoplasm of axilla and upper limb lymph nodes: Secondary | ICD-10-CM | POA: Diagnosis not present

## 2020-04-16 DIAGNOSIS — Z79899 Other long term (current) drug therapy: Secondary | ICD-10-CM | POA: Insufficient documentation

## 2020-04-16 DIAGNOSIS — C50912 Malignant neoplasm of unspecified site of left female breast: Secondary | ICD-10-CM | POA: Insufficient documentation

## 2020-04-16 DIAGNOSIS — Z9221 Personal history of antineoplastic chemotherapy: Secondary | ICD-10-CM | POA: Insufficient documentation

## 2020-04-16 DIAGNOSIS — I427 Cardiomyopathy due to drug and external agent: Secondary | ICD-10-CM | POA: Diagnosis present

## 2020-04-16 DIAGNOSIS — Z853 Personal history of malignant neoplasm of breast: Secondary | ICD-10-CM | POA: Insufficient documentation

## 2020-04-16 DIAGNOSIS — I10 Essential (primary) hypertension: Secondary | ICD-10-CM | POA: Diagnosis not present

## 2020-04-16 LAB — BASIC METABOLIC PANEL
Anion gap: 8 (ref 5–15)
BUN: 17 mg/dL (ref 6–20)
CO2: 26 mmol/L (ref 22–32)
Calcium: 9.4 mg/dL (ref 8.9–10.3)
Chloride: 106 mmol/L (ref 98–111)
Creatinine, Ser: 0.64 mg/dL (ref 0.44–1.00)
GFR calc Af Amer: >60 mL/min (ref >60)
GFR calc non Af Amer: >60 mL/min (ref >60)
Glucose, Bld: 82 mg/dL (ref 70–99)
Potassium: 3.8 mmol/L (ref 3.5–5.1)
Sodium: 140 mmol/L (ref 135–145)

## 2020-04-16 MED ORDER — SPIRONOLACTONE 25 MG PO TABS: 25.0000 mg | ORAL_TABLET | Freq: Every day | ORAL | 3 refills | Status: DC

## 2020-04-16 NOTE — Patient Instructions (Signed)
It was a pleasure seeing you today!  MEDICATIONS: -We are changing your medications today -Start Spironolactone 25 mg daily. We will repeat your labs in 1 week. -Call if you have questions about your medications.  LABS: -We will call you if your labs need attention.  NEXT APPOINTMENT: Return to clinic in 1 week for lab visit.  In general, to take care of your heart failure: -Limit your fluid intake to 2 Liters (half-gallon) per day.   -Limit your salt intake to ideally 2-3 grams (2000-3000 mg) per day. -Weigh yourself daily and record, and bring that "weight diary" to your next appointment.  (Weight gain of 2-3 pounds in 1 day typically means fluid weight.) -The medications for your heart are to help your heart and help you live longer.   -Please contact us before stopping any of your heart medications.  Call the clinic at [phone] with questions or to reschedule future appointments.

## 2020-04-16 NOTE — Progress Notes (Signed)
Referring Physician: Dr. Bettina Gavia Primary Cardiologist: Dr. Bettina Gavia Primary Oncologist: Dr. Marcy Panning [facility]) HF Cardiologist: Dr. Haroldine Laws  HPI: Darlene Kline is a 39 y.o. 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 Bettina Gavia to establish in the cardio-oncology clinic for evaluation of possible chemotherapy cardiotoxicity.   She denies any h/o known heart disease. Found to have left breast CA in 8/18. Initially HER2-neu negative. Completed initial therapy with Taxotere, Adriamycin, Cyclophosphamide beginning 08/25/17 - 12/08/2017 x 6 cycles. After surgery found to be HER2+ and started on Kadcyla in 6/19. Echo 6/19 reported 55-60%. Echo in 9/19 showed EF 40-45% [facility]). Kadcyla stopped for 3 months. Started on valsartan and Toprol.  EF subsequently dropped a bit more.   Patient returned to heart failure clinic on 10/29/2019. She had finished Kadcyla got 12/15 doses or so. Felt to be in remission. She was getting anti-estrogens only at that time. Felt good. Active. Exercising. No edema, orthopnea or PND. Occasional mild SOB with moderate exertion. BP remained high.  ECHO 04/09/20 EF 45-50%  Today she returns to HF clinic for pharmacist medication titration. At last visit with MD, valsartan was discontinued, Entresto was initiated at 49/51 mg BID, and carvedilol was increased to 6.25 mg BID. Symptomatically, she is feeling good. Denies dizziness, lightheadedness, and fatigue. No chest pain and intermittent palpitations in the past with exertion. Gets mild SOB when exercising or when climbing hills or stairs. She is able to complete all ADLs. Activity level has improved. She recently got a [place] and has been doing workouts several times per week. She checks her weight at home (normal range 152 - 158 lbs). She states that her weight this morning was 151 lbs. Weight in clinic was 153.8 lbs. She does not take a loop diuretic.  No LEE. No PND/Orthopnea. Appetite has been normal lately (somewhat decreased). She adheres to a low-salt diet.   HF Medications: Carvedilol 6.25 mg BID  Entresto 49/51 mg BID  Has the patient been experiencing any side effects to the medications prescribed?  no  Does the patient have any problems obtaining medications due to transportation or finances?   No - has [employer] of regimen: good Understanding of indications: good Potential of compliance: good Patient understands to avoid NSAIDs. Patient understands to avoid decongestants.    Pertinent Lab Values (04/16/20): Marland Kitchen Serum creatinine 0.64, BUN 17, Potassium 3.8, Sodium 140  Vital Signs: . Weight: 153.8 lbs (last clinic weight: 142 lbs) . Blood pressure: 132/70  . Heart rate: 79   Assessment: 1.  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  - Now on anti-estrogens - Follows with Dr. Humphrey Rolls  2. Nonischemic CM - EF stable now at 45-50%  - Suspect due to Adriamycin. - Continue carvedilol 6.25 mg BID - Continue Entresto 49/51 mg BID - Initiate spironolactone 25 mg daily. BMET in 1 week.   3. HTN - BP 132/70. Continue carvedilol, Entresto, and spironolactone as above  Plan: 1) Medication changes: Based on clinical presentation, vital signs and recent labs will initiate spironolactone 25 mg daily 2) Labs: Scr 0.64, K 3.8 3) Follow-up: 1 week for BMET recheck, 4 weeks with pharmacy clinic & 6 weeks with Dr. Angela Nevin, PharmD PGY1 Ambulatory Care Pharmacy Resident  Audry Riles, PharmD, BCPS, [facility], CPP Heart Failure Clinic Pharmacist [phone]

## 2020-04-24 ENCOUNTER — Ambulatory Visit (HOSPITAL_COMMUNITY)
Admission: RE | Admit: 2020-04-24 | Discharge: 2020-04-24 | Disposition: A | Discharge status: Home / Self Care | Payer: Managed Care, Other (non HMO) | Source: Ambulatory Visit | Attending: Cardiology | Admitting: Cardiology

## 2020-04-24 ENCOUNTER — Other Ambulatory Visit: Payer: Self-pay

## 2020-04-24 ENCOUNTER — Other Ambulatory Visit (HOSPITAL_COMMUNITY): Payer: Self-pay | Admitting: *Deleted

## 2020-04-24 DIAGNOSIS — I427 Cardiomyopathy due to drug and external agent: Secondary | ICD-10-CM | POA: Insufficient documentation

## 2020-04-24 DIAGNOSIS — T451X5A Adverse effect of antineoplastic and immunosuppressive drugs, initial encounter: Secondary | ICD-10-CM | POA: Diagnosis not present

## 2020-04-24 LAB — BASIC METABOLIC PANEL
Anion gap: 10 (ref 5–15)
BUN: 20 mg/dL (ref 6–20)
CO2: 26 mmol/L (ref 22–32)
Calcium: 9.8 mg/dL (ref 8.9–10.3)
Chloride: 104 mmol/L (ref 98–111)
Creatinine, Ser: 0.83 mg/dL (ref 0.44–1.00)
GFR calc Af Amer: >60 mL/min (ref >60)
GFR calc non Af Amer: >60 mL/min (ref >60)
Glucose, Bld: 92 mg/dL (ref 70–99)
Potassium: 3.8 mmol/L (ref 3.5–5.1)
Sodium: 140 mmol/L (ref 135–145)

## 2020-05-14 ENCOUNTER — Other Ambulatory Visit: Payer: Self-pay

## 2020-05-14 ENCOUNTER — Ambulatory Visit (HOSPITAL_COMMUNITY)
Admission: RE | Admit: 2020-05-14 | Discharge: 2020-05-14 | Disposition: A | Discharge status: Home / Self Care | Payer: Managed Care, Other (non HMO) | Source: Ambulatory Visit | Attending: Pharmacist | Admitting: Pharmacist

## 2020-05-14 VITALS — BP 118/70 | HR 83 | Wt 152.8 lb

## 2020-05-14 DIAGNOSIS — C50912 Malignant neoplasm of unspecified site of left female breast: Secondary | ICD-10-CM | POA: Diagnosis present

## 2020-05-14 DIAGNOSIS — C773 Secondary and unspecified malignant neoplasm of axilla and upper limb lymph nodes: Secondary | ICD-10-CM | POA: Insufficient documentation

## 2020-05-14 DIAGNOSIS — I427 Cardiomyopathy due to drug and external agent: Secondary | ICD-10-CM

## 2020-05-14 DIAGNOSIS — I428 Other cardiomyopathies: Secondary | ICD-10-CM | POA: Insufficient documentation

## 2020-05-14 DIAGNOSIS — I10 Essential (primary) hypertension: Secondary | ICD-10-CM | POA: Diagnosis not present

## 2020-05-14 DIAGNOSIS — Z9221 Personal history of antineoplastic chemotherapy: Secondary | ICD-10-CM | POA: Diagnosis not present

## 2020-05-14 DIAGNOSIS — Z171 Estrogen receptor negative status [ER-]: Secondary | ICD-10-CM | POA: Insufficient documentation

## 2020-05-14 DIAGNOSIS — Z79899 Other long term (current) drug therapy: Secondary | ICD-10-CM | POA: Insufficient documentation

## 2020-05-14 MED ORDER — ENTRESTO 97-103 MG PO TABS: 1.0000 | ORAL_TABLET | Freq: Two times a day (BID) | ORAL | 3 refills | Status: DC

## 2020-05-14 NOTE — Patient Instructions (Addendum)
It was a pleasure seeing you today!  MEDICATIONS: -We are changing your medications today -Increase Entresto to 97/103 mg (1 tablet) twice a day. You can take 2 tablets twice a day of your current Entresto 49/51 mg prescription until you obtain the new supply. -Call if you have questions about your medications.  NEXT APPOINTMENT: Return to clinic in 3 weeks with Dr. Mahalia Longest  In general, to take care of your heart failure: -Limit your fluid intake to 2 Liters (half-gallon) per day.   -Limit your salt intake to ideally 2-3 grams (2000-3000 mg) per day. -Weigh yourself daily and record, and bring that "weight diary" to your next appointment.  (Weight gain of 2-3 pounds in 1 day typically means fluid weight.) -The medications for your heart are to help your heart and help you live longer.   -Please contact us before stopping any of your heart medications.  Call the clinic at [phone] with questions or to reschedule future appointments.

## 2020-05-14 NOTE — Progress Notes (Signed)
Referring Physician: Dr. Bettina Gavia Primary Cardiologist: Dr. Bettina Gavia Primary Oncologist: Dr. Marcy Panning [facility]) HF Cardiologist: Dr. Haroldine Laws   HPI: Darlene Kline is a 39 y.o. 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 Bettina Gavia to establish in the cardio-oncology clinic for evaluation of possible chemotherapy cardiotoxicity.   She denies any h/o known heart disease. Found to have left breast CA in 8/18. Initially HER2-neu negative. Completed initial therapy with Taxotere, Adriamycin, Cyclophosphamide beginning 08/25/17 - 12/08/2017 x 6 cycles. After surgery found to be HER2+ and started on Kadcyla in 04/2018. Echo 04/2018 reported 55-60%. Echo in 9/19 showed EF 40-45% [facility]). Kadcyla stopped for 3 months. Started on valsartan and Toprol.  EF subsequently dropped a bit more. Patient finished Kadcyla and breast cancer felt to be in remission.  Patient returned to heart failure clinic on 10/29/2019. She had finished Kadcyla got 12/15 doses or so. Felt to be in remission. She was getting anti-estrogens only at that time. Felt good. Active. Exercising. No edema, orthopnea or PND. Occasional mild SOB with moderate exertion. BP remained high.   Recently presented to pharmacy HF Clinic on 04/16/20. Symptomatically, she was feeling good. Denied dizziness, lightheadedness, and fatigue. Denied chest pain or palpitations. Experienced mild SOB when exercising or when climbing hills or stairs. She was able to complete all ADLs. Activity level had improved significantly. She had been completing workouts several times per week on her new [place]. Her weights were stable, no LEE, and no PND/orthopnea.   ECHO 04/09/20 EF 45-50%  Today she returns to HF clinic for pharmacist medication titration. At last visit with pharmacy HF clinic, spironolactone 25 mg was initiated. Symptomatically, she is feeling good. Denies dizziness, lightheadedness,  and fatigue. Feeling a bit more tired than usual since Lupron started, which is to be expected. Did note some random episodes of chest pain that was non-exertional and more positional, thus, thought to be musculoskeletal. Reports that she is now able to climb hills or stairs without becoming SOB. She is using her [place] machine 3-4 times a week. She is able to complete all ADLs. Her weight has been stable at home.  She does not take a loop diuretic. No LEE. No PND/Orthopnea. Appetite has been normal. She adheres to a low-salt diet, mostly eating chicken, seafood, eggs.   HF Medications: Carvedilol 6.25 mg BID  Entresto 49/51 mg BID Spironolactone 25 mg  Has the patient been experiencing any side effects to the medications prescribed?  A bit of tiredness, hot flashes from Lupron  Does the patient have any problems obtaining medications due to transportation or finances?   No - has [place]. $10 Co-pay card for [place] given to patient.  Understanding of regimen: good Understanding of indications: good Potential of compliance: good Patient understands to avoid NSAIDs. Patient understands to avoid decongestants.    Pertinent Lab Values (04/24/20): Marland Kitchen Serum creatinine 0.83, BUN 20, Potassium 3.8, Sodium 140  Vital Signs: . Weight: 152.8 lbs (last clinic weight: 153.8 lbs) . Blood pressure: 118/70 . Heart rate: 83  Assessment: 1.  Carcinoma of L breast metastatic to axillary lymph node - Has completed chemo and Kadcyla.  - Now on anti-estrogens - Follows with Dr. Humphrey Rolls   2. Nonischemic CM - EF stable now at 45-50%  - Suspect due to Adriamycin. - Continue carvedilol 6.25 mg BID - Increase Entresto to 97/103 mg BID. Repeat BMET in 2 weeks.  -  Continue spironolactone 25 mg daily. Repeat BMET was stable.  3. HTN - BP 118/70. Continue carvedilol, Entresto, and spironolactone as above  Plan: 1) Medication changes: Based on clinical presentation, vital signs and  recent labs will increase Entresto to 97/103 mg BID. 3) Follow-up: Office visit in 2 weeks with Dr. Haroldine Laws   Sherren Kerns, PharmD PGY1 [place] Resident  Audry Riles, PharmD, BCPS, [facility], CPP Heart Failure Clinic Pharmacist [phone]

## 2020-05-28 ENCOUNTER — Other Ambulatory Visit: Payer: Self-pay

## 2020-05-28 ENCOUNTER — Encounter (HOSPITAL_COMMUNITY): Payer: Self-pay | Admitting: Internal Medicine

## 2020-05-28 ENCOUNTER — Ambulatory Visit (HOSPITAL_COMMUNITY)
Admission: RE | Admit: 2020-05-28 | Discharge: 2020-05-28 | Disposition: A | Discharge status: Home / Self Care | Payer: Managed Care, Other (non HMO) | Source: Ambulatory Visit | Attending: Internal Medicine | Admitting: Internal Medicine

## 2020-05-28 VITALS — BP 118/76 | HR 80 | Wt 155.0 lb

## 2020-05-28 DIAGNOSIS — I509 Heart failure, unspecified: Secondary | ICD-10-CM | POA: Diagnosis present

## 2020-05-28 DIAGNOSIS — I428 Other cardiomyopathies: Secondary | ICD-10-CM | POA: Diagnosis not present

## 2020-05-28 DIAGNOSIS — I10 Essential (primary) hypertension: Secondary | ICD-10-CM

## 2020-05-28 DIAGNOSIS — Z853 Personal history of malignant neoplasm of breast: Secondary | ICD-10-CM | POA: Diagnosis not present

## 2020-05-28 DIAGNOSIS — Z79899 Other long term (current) drug therapy: Secondary | ICD-10-CM | POA: Insufficient documentation

## 2020-05-28 DIAGNOSIS — T451X5A Adverse effect of antineoplastic and immunosuppressive drugs, initial encounter: Secondary | ICD-10-CM | POA: Diagnosis not present

## 2020-05-28 DIAGNOSIS — C50911 Malignant neoplasm of unspecified site of right female breast: Secondary | ICD-10-CM

## 2020-05-28 DIAGNOSIS — Z7901 Long term (current) use of anticoagulants: Secondary | ICD-10-CM | POA: Insufficient documentation

## 2020-05-28 DIAGNOSIS — I427 Cardiomyopathy due to drug and external agent: Secondary | ICD-10-CM

## 2020-05-28 DIAGNOSIS — C773 Secondary and unspecified malignant neoplasm of axilla and upper limb lymph nodes: Secondary | ICD-10-CM | POA: Insufficient documentation

## 2020-05-28 DIAGNOSIS — Z8249 Family history of ischemic heart disease and other diseases of the circulatory system: Secondary | ICD-10-CM | POA: Diagnosis not present

## 2020-05-28 DIAGNOSIS — I110 Hypertensive heart disease with heart failure: Secondary | ICD-10-CM | POA: Diagnosis not present

## 2020-05-28 DIAGNOSIS — C50912 Malignant neoplasm of unspecified site of left female breast: Secondary | ICD-10-CM | POA: Diagnosis not present

## 2020-05-28 MED ORDER — CARVEDILOL 6.25 MG PO TABS: 9.3750 mg | ORAL_TABLET | Freq: Two times a day (BID) | ORAL | 3 refills | Status: DC

## 2020-05-28 NOTE — Progress Notes (Signed)
Cardio-Oncology Clinic Consult Note   Referring Physician: Dr. Bettina Gavia Primary Cardiologist: Dr. Bettina Gavia Primary Oncologist: Dr. Marcy Panning [facility])  HPI: Darlene Kline is a 38 y.o. 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 Bettina Gavia to establish in the cardio-oncology clinic for evaluation of possible chemotherapy cardiotoxicity.   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 There is a superficial irregular hypoechoic mass at the 3 o'clock position of the left breast 1 cm from the nipple measuring 0.7 x 1.3 x 1.7 cm located 1.2 cm from the larger mass. Ultrasound of the left axilla demonstrated an abnormal 1 cm node over the region with near complete replacement of the fatty hilum. There was also a little much larger abnormal lymph node higher in the left axilla with cortical thickness of 8 mm.   07/07/2017 Bone scan     No evident bony metastatic disease.    07/17/2017 US Biopsy    Invasive ductal carcinoma with rare foci suspicious for high-grade ductal carcinoma in situ. Biopsy of the left axillary lymph node was consistent with metastatic carcinoma with breast primary. Prognostic panel revealed the tumor to be estrogen receptor 95% positive strong progesterone receptor 10% positive and HER-2/neu by immunohistochemical staining negative, 0 Genetic testing: VUS in CHEK2   01/04/2018 Surgery    Underwent bilateral mastectomy, right nipple sparing with immediate reconstruction on -> Final pathology right benign, left largest focus residual IDC 0.3 cm, +4/13  LN, margins negative. Prognostic panel testing revealed the tumor to be HER-2 positive.   4/4 - 03/28/18 Radiation    February 22, 2018 through Mar 28, 2018. Patient experienced localized skin reaction including desquamation in the upper outer quadrant treated with topical creams.       She denies any h/o known heart disease. Found to have left breast CA in 8/18. Initially HER2-neu negative. Completed initial therapy with Taxotere, Adriamycin, Cyclophosphamide beginning 08/25/17 - 12/08/2017 x 6 cycles. After surgery found to be HER2+ and started on Kadcyla in 6/19. Echo 6/19 reported 55-60%. Echo in 9/19 showed EF 40-45% [facility]). Kadcyla stopped for 3 months. Started on valsartan and Toprol.  EF subsequently dropped a bit more.   Returns for routine f/u. Has finished Kadcyla got 12/15 doses or so. Felt to be in remission. Getting anti-estrogens only now. Has been following with PharmD Clinic for med titration. Says she feels good. No SOB, orthopnea or PND. BP is improved but no low episodes. Exercises on the [place].   Echo 5/21  EF 45-50% cMRI 3/20 EF 44% No LGE.   Echo 1/20 read formally as 25-30% Likely closer to 35-40% Echo 09/19/2018 shows 50-55% (I felt, 40-45%)  MUGA 08/17/18 LVEF 40%, Limited exam because of overlying breast tissue expanders.  Echo 07/24/18 LVEF 40-45% [facility]) Echo 04/23/18 LVEF 55-60%, Mild/Mod MR [facility]) Echo 02/20/18 LVEF 55-60%, GLS -17.7. [facility]) Echo 08/07/17 LVEF "normal"  Review of systems complete and found to be negative unless listed in HPI.   Past Medical History:  Diagnosis Date  . Anemia  off and on d/t chemo  . Breast cancer, left breast ([place])   . CHF (congestive heart failure) ([place])   . 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 anastrozole (ARIMIDEX) 1 MG tablet Take 1 mg by mouth daily.    . carvedilol (COREG) 6.25  MG tablet Take 1 tablet (6.25 mg total) by mouth 2 (two) times daily with a meal. 180 tablet 3  . Cobalamin Combinations (VITAMIN B12-FOLIC ACID PO) Take 1 mL by mouth daily. Vitamin B12 1000                      Folic Acid 659 mg    . leuprolide (LUPRON) 11.25 MG injection Inject 11.25 mg into the muscle every 21 ( twenty-one) days.    . Multiple Vitamin (MULTIVITAMIN) tablet Take 1 tablet by mouth daily.    . Potassium Chloride ER 20 MEQ TBCR Take 20 mEq by mouth daily. 90 tablet 3  . Probiotic Product (PROBIOTIC DAILY PO) Take 1 tablet by mouth daily. Alternate with Kombucha drink    . sacubitril-valsartan (ENTRESTO) 97-103 MG Take 1 tablet by mouth 2 (two) times daily. 180 tablet 3  . spironolactone (ALDACTONE) 25 MG tablet Take 1 tablet (25 mg total) by mouth daily. 90 tablet 3   No current facility-administered medications for this encounter.   Allergies  Allergen Reactions  . Adhesive [Tape] Other (See Comments)    Sensitive to skin Hyperfix (white tape)    Social History   Socioeconomic History  . Marital status: Married    Spouse name: Not on file  . Number of children: Not on file  . Years of education: Not on file  . Highest education level: Not on file  Occupational History  . Not on file  Tobacco Use  . Smoking status: Never Smoker  . Smokeless tobacco: Never Used  Vaping Use  . Vaping Use: Never used  Substance and Sexual Activity  . Alcohol use: Not Currently    Comment: "maybe 2 times a month"  . Drug use: No  . Sexual activity: Yes  Other Topics Concern  . Not on file  Social History Narrative  . Not on file   Social Determinants of Health   Financial Resource Strain:   . Difficulty of Paying Living Expenses:   Food Insecurity:   . Worried About Charity fundraiser in the Last Year:   . Arboriculturist in the Last Year:   Transportation Needs:   . Film/video editor (Medical):   Marland Kitchen Lack of Transportation (Non-Medical):   Physical Activity:   .  Days of Exercise per Week:   . Minutes of Exercise per Session:   Stress:   . Feeling of Stress :   Social Connections:   . Frequency of Communication with Friends and Family:   . Frequency of Social Gatherings with Friends and Family:   . Attends Religious Services:   . Active Member of Clubs or Organizations:   . Attends Archivist Meetings:   Marland Kitchen Marital Status:   Intimate Partner Violence:   . Fear of Current or Ex-Partner:   . Emotionally Abused:   Marland Kitchen Physically Abused:   . Sexually Abused:     Family History  Problem Relation Age of Onset  . Hypertension Mother   . Heart attack Maternal Grandfather  Vitals:   05/28/20 1509  BP: 118/76  Pulse: 80  SpO2: 99%  Weight: 70.3 kg (155 lb)   Wt Readings from Last 3 Encounters:  05/28/20 70.3 kg (155 lb)  05/14/20 69.3 kg (152 lb 12.8 oz)  04/16/20 69.8 kg (153 lb 12.8 oz)    PHYSICAL EXAM: General:  Well appearing. No resp difficulty HEENT: normal Neck: supple. no JVD. Carotids 2+ bilat; no bruits. No lymphadenopathy or thryomegaly appreciated. Cor: PMI nondisplaced. Regular rate & rhythm. No rubs, gallops or murmurs. Lungs: clear Abdomen: soft, nontender, nondistended. No hepatosplenomegaly. No bruits or masses. Good bowel sounds. Extremities: no cyanosis, clubbing, rash, edema Neuro: alert & orientedx3, cranial nerves grossly intact. moves all 4 extremities w/o difficulty. Affect pleasant   ASSESSMENT & PLAN:  1.  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  - now on anti-estrogens - follows with Dr. Humphrey Rolls - pending oophorectomy - ok to proceed from cardiac standpoint  2. Nonischemic CM - Echo 5/21 45-50% - suspect due to Adriamycin. EF now improved - NYHA I - Continue Entresto 97/103 bid - Increase carvedilol to 9.375 bid - Continue spiro 25 daily - Would not start SGLT2i with NYHA I and improving EF.  - Labs ok in June - repeat echo in 6  months  3. HTN - Blood pressure well controlled. Continue current regimen.   Glori Bickers, MD  3:27 PM

## 2020-05-28 NOTE — Patient Instructions (Addendum)
No labs done today.   INCREASE Carvedilol 9.375mg (1& 1/2 tablets) by mouth 2 times daily.  No other medication changes were made. Please continue all current medications as prescribed.   Your physician recommends that you schedule a follow-up appointment in: 6 months with an echo prior to your exam. Our office will contact you at a later date to schedule an appointment.   Your physician has requested that you have an echocardiogram. Echocardiography is a painless test that uses sound waves to create images of your heart. It provides your doctor with information about the size and shape of your heart and how well your heart's chambers and valves are working. This procedure takes approximately one hour. There are no restrictions for this procedure.   At the [facility], you and your health needs are our priority. As part of our continuing mission to provide you with exceptional heart care, we have created designated Provider Care Teams. These Care Teams include your primary Cardiologist (physician) and Advanced Practice Providers (APPs- Physician Assistants and Nurse Practitioners) who all work together to provide you with the care you need, when you need it.   You may see any of the following providers on your designated Care Team at your next follow up: Marland Kitchen Dr Glori Bickers . Dr Loralie Champagne . Darrick Grinder, NP . Lyda Jester, PA . Audry Riles, PharmD   Please be sure to bring in all your medications bottles to every appointment.

## 2020-05-28 NOTE — Addendum Note (Signed)
Encounter addended by: Shonna Chock, CMA on: 07/23/9573 3:47 PM  Actions taken: Order list changed, Diagnosis association updated, Clinical Note Signed, Follow-up modified

## 2020-10-28 ENCOUNTER — Other Ambulatory Visit (HOSPITAL_COMMUNITY): Payer: Self-pay | Admitting: Internal Medicine

## 2020-12-09 ENCOUNTER — Other Ambulatory Visit (HOSPITAL_COMMUNITY): Payer: Self-pay | Admitting: Internal Medicine

## 2020-12-09 MED ORDER — ENTRESTO 97-103 MG PO TABS: 1.0000 | ORAL_TABLET | Freq: Two times a day (BID) | ORAL | 0 refills | Status: DC

## 2021-04-23 ENCOUNTER — Other Ambulatory Visit (HOSPITAL_COMMUNITY): Payer: Self-pay

## 2021-04-23 ENCOUNTER — Telehealth (HOSPITAL_COMMUNITY): Payer: Self-pay | Admitting: Pharmacy Technician

## 2021-04-23 ENCOUNTER — Other Ambulatory Visit (HOSPITAL_COMMUNITY): Payer: Self-pay | Admitting: *Deleted

## 2021-04-23 MED ORDER — ENTRESTO 97-103 MG PO TABS: 1.0000 | ORAL_TABLET | Freq: Two times a day (BID) | ORAL | 3 refills | Status: DC

## 2021-04-23 NOTE — Telephone Encounter (Signed)
Advanced Heart Failure Patient Advocate Encounter  I received a message that the patient's co-pay for Entresto for unaffordable. Upon investigation, a 90 day supply, $1655. Went ahead and activated a $10 co-pay card for the patient. The patient's insurance requires 90 day supplies in the future. Sent Jasmine, ([place]) a request to send in an updated script with the attached card information.   BIN [id number]  PCN [id number]  Group [id number]  ID [id number]  Called and left the patient message.   Charlann Boxer, CPhT

## 2021-05-13 ENCOUNTER — Other Ambulatory Visit (HOSPITAL_COMMUNITY): Payer: Self-pay | Admitting: Internal Medicine

## 2021-05-13 DIAGNOSIS — T451X5A Adverse effect of antineoplastic and immunosuppressive drugs, initial encounter: Secondary | ICD-10-CM

## 2021-05-27 ENCOUNTER — Other Ambulatory Visit: Payer: Self-pay

## 2021-05-27 ENCOUNTER — Ambulatory Visit (HOSPITAL_COMMUNITY)
Admission: RE | Admit: 2021-05-27 | Discharge: 2021-05-27 | Disposition: A | Discharge status: Home / Self Care | Payer: Managed Care, Other (non HMO) | Source: Ambulatory Visit | Attending: Internal Medicine | Admitting: Internal Medicine

## 2021-05-27 DIAGNOSIS — C50911 Malignant neoplasm of unspecified site of right female breast: Secondary | ICD-10-CM

## 2021-05-27 DIAGNOSIS — Z0189 Encounter for other specified special examinations: Secondary | ICD-10-CM | POA: Diagnosis not present

## 2021-05-27 DIAGNOSIS — Z9882 Breast implant status: Secondary | ICD-10-CM | POA: Insufficient documentation

## 2021-05-27 DIAGNOSIS — I509 Heart failure, unspecified: Secondary | ICD-10-CM | POA: Insufficient documentation

## 2021-05-27 DIAGNOSIS — I110 Hypertensive heart disease with heart failure: Secondary | ICD-10-CM | POA: Diagnosis not present

## 2021-05-27 LAB — ECHOCARDIOGRAM COMPLETE
Area-P 1/2: 3.81 cm2
S' Lateral: 3 cm
Single Plane A4C EF: 50.9 %

## 2021-05-27 NOTE — Progress Notes (Signed)
  Echocardiogram 2D Echocardiogram has been performed.  Darlene Kline 05/27/2021, 11:46 AM

## 2021-06-23 ENCOUNTER — Ambulatory Visit (HOSPITAL_COMMUNITY)
Admission: RE | Admit: 2021-06-23 | Discharge: 2021-06-23 | Disposition: A | Discharge status: Home / Self Care | Payer: Managed Care, Other (non HMO) | Source: Ambulatory Visit | Attending: Internal Medicine | Admitting: Internal Medicine

## 2021-06-23 ENCOUNTER — Encounter (HOSPITAL_COMMUNITY): Payer: Self-pay | Admitting: Internal Medicine

## 2021-06-23 ENCOUNTER — Other Ambulatory Visit: Payer: Self-pay

## 2021-06-23 VITALS — BP 158/90 | HR 89 | Wt 159.0 lb

## 2021-06-23 DIAGNOSIS — I10 Essential (primary) hypertension: Secondary | ICD-10-CM | POA: Diagnosis not present

## 2021-06-23 DIAGNOSIS — C773 Secondary and unspecified malignant neoplasm of axilla and upper limb lymph nodes: Secondary | ICD-10-CM | POA: Diagnosis not present

## 2021-06-23 DIAGNOSIS — Z90721 Acquired absence of ovaries, unilateral: Secondary | ICD-10-CM | POA: Insufficient documentation

## 2021-06-23 DIAGNOSIS — C50911 Malignant neoplasm of unspecified site of right female breast: Secondary | ICD-10-CM | POA: Diagnosis not present

## 2021-06-23 DIAGNOSIS — Z9079 Acquired absence of other genital organ(s): Secondary | ICD-10-CM | POA: Insufficient documentation

## 2021-06-23 DIAGNOSIS — T451X5A Adverse effect of antineoplastic and immunosuppressive drugs, initial encounter: Secondary | ICD-10-CM

## 2021-06-23 DIAGNOSIS — Z79811 Long term (current) use of aromatase inhibitors: Secondary | ICD-10-CM | POA: Diagnosis not present

## 2021-06-23 DIAGNOSIS — I427 Cardiomyopathy due to drug and external agent: Secondary | ICD-10-CM | POA: Diagnosis not present

## 2021-06-23 DIAGNOSIS — Z79899 Other long term (current) drug therapy: Secondary | ICD-10-CM | POA: Diagnosis not present

## 2021-06-23 DIAGNOSIS — Z9013 Acquired absence of bilateral breasts and nipples: Secondary | ICD-10-CM | POA: Diagnosis not present

## 2021-06-23 DIAGNOSIS — Z853 Personal history of malignant neoplasm of breast: Secondary | ICD-10-CM | POA: Diagnosis not present

## 2021-06-23 DIAGNOSIS — I428 Other cardiomyopathies: Secondary | ICD-10-CM | POA: Insufficient documentation

## 2021-06-23 DIAGNOSIS — I110 Hypertensive heart disease with heart failure: Secondary | ICD-10-CM | POA: Diagnosis not present

## 2021-06-23 LAB — CBC
HCT: 35.6 % — ABNORMAL LOW (ref 36.0–46.0)
Hemoglobin: 11.9 g/dL — ABNORMAL LOW (ref 12.0–15.0)
MCH: 31.5 pg (ref 26.0–34.0)
MCHC: 33.4 g/dL (ref 30.0–36.0)
MCV: 94.2 fL (ref 80.0–100.0)
Platelets: 297 10*3/uL (ref 150–400)
RBC: 3.78 MIL/uL — ABNORMAL LOW (ref 3.87–5.11)
RDW: 12.5 % (ref 11.5–15.5)
WBC: 6 10*3/uL (ref 4.0–10.5)
nRBC: 0 % (ref 0.0–0.2)

## 2021-06-23 LAB — BASIC METABOLIC PANEL
Anion gap: 10 (ref 5–15)
BUN: 10 mg/dL (ref 6–20)
CO2: 26 mmol/L (ref 22–32)
Calcium: 9.4 mg/dL (ref 8.9–10.3)
Chloride: 102 mmol/L (ref 98–111)
Creatinine, Ser: 0.74 mg/dL (ref 0.44–1.00)
GFR, Estimated: >60 mL/min (ref >60)
Glucose, Bld: 85 mg/dL (ref 70–99)
Potassium: 3.9 mmol/L (ref 3.5–5.1)
Sodium: 138 mmol/L (ref 135–145)

## 2021-06-23 LAB — IRON AND TIBC
Iron: 80 ug/dL (ref 28–170)
Saturation Ratios: 21 % (ref 10.4–31.8)
TIBC: 374 ug/dL (ref 250–450)
UIBC: 294 ug/dL

## 2021-06-23 LAB — BRAIN NATRIURETIC PEPTIDE: B Natriuretic Peptide: 34.2 pg/mL (ref 0.0–100.0)

## 2021-06-23 LAB — FERRITIN: Ferritin: 74 ng/mL (ref 11–307)

## 2021-06-23 MED ORDER — CARVEDILOL 12.5 MG PO TABS: 12.5000 mg | ORAL_TABLET | Freq: Two times a day (BID) | ORAL | 6 refills | Status: DC

## 2021-06-23 NOTE — Patient Instructions (Signed)
Increase Carvedilol to 12.5 mg Twice daily   Labs done today, we will call you for abnormal results  Your physician has requested that you regularly monitor and record your blood pressure readings at home. Please use the same machine at the same time of day to check your readings and record them to bring to your follow-up visit.  Your physician recommends that you schedule a follow-up appointment in: 3-4 months  If you have any questions or concerns before your next appointment please send [place] a message through [place] or call our office at [phone].    TO LEAVE A MESSAGE FOR THE NURSE SELECT OPTION 2, PLEASE LEAVE A MESSAGE INCLUDING: YOUR NAME DATE OF BIRTH CALL BACK NUMBER REASON FOR CALL**this is important as we prioritize the call backs  YOU WILL RECEIVE A CALL BACK THE SAME DAY AS LONG AS YOU CALL BEFORE 4:00 PM  milAt the Advanced Heart Failure Clinic, you and your health needs are our priority. As part of our continuing mission to provide you with exceptional heart care, we have created designated Provider Care Teams. These Care Teams include your primary Cardiologist (physician) and Advanced Practice Providers (APPs- Physician Assistants and Nurse Practitioners) who all work together to provide you with the care you need, when you need it.   You may see any of the following providers on your designated Care Team at your next follow up: Dr Glori Bickers Dr Loralie Champagne Dr Patrice Paradise, NP Lyda Jester, [place] Ginnie Smart Audry Riles, PharmD   Please be sure to bring in all your medications bottles to every appointment.

## 2021-06-23 NOTE — Progress Notes (Signed)
Cardio-Oncology Clinic Note   Referring Physician: Dr. Bettina Gavia Primary Cardiologist: Dr. Bettina Gavia Primary Oncologist: Dr. Marcy Panning [facility])  HPI: Darlene Kline is a 40 y.o. 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 Bettina Gavia to establish in the cardio-oncology clinic for evaluation of possible chemotherapy cardiotoxicity.   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 There is a superficial irregular hypoechoic mass at the 3 o'clock position of the left breast 1 cm from the nipple measuring 0.7 x 1.3 x 1.7 cm located 1.2 cm from the larger mass. Ultrasound of the left axilla demonstrated an abnormal 1 cm node over the region with near complete replacement of the fatty hilum. There was also a little much larger abnormal lymph node higher in the left axilla with cortical thickness of 8 mm.   07/07/2017 Bone scan     No evident bony metastatic disease.    07/17/2017 US Biopsy    Invasive ductal carcinoma with rare foci suspicious for high-grade ductal carcinoma in situ. Biopsy of the left axillary lymph node was consistent with metastatic carcinoma with breast primary. 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 Prognostic panel testing revealed the tumor to be HER-2 positive.   4/4 - 03/28/18 Radiation    February 22, 2018 through Mar 28, 2018. Patient experienced localized skin reaction including desquamation in the upper outer quadrant treated with topical creams.       She denies any h/o known heart disease. Found to have left breast CA in 8/18. Initially HER2-neu negative. Completed initial therapy with Taxotere, Adriamycin, Cyclophosphamide beginning 08/25/17 - 12/08/2017 x 6 cycles. After surgery found to be HER2+ and started on Kadcyla in 6/19. Echo 6/19 reported 55-60%. Echo in 9/19 showed EF 40-45% [facility]). Kadcyla stopped for 3 months. Started on valsartan and Toprol.  EF subsequently dropped a bit more.   Last visit finished Kadcyla got 12/15 doses or so. Felt to be in remission. Getting anti-estrogens only now. Was doing great.  Here for FU visit had ECHO last month.  Has been feeling well, denies any dyspnea, orthopnea or PND.  Doesn't check bp at home.  Working out at [employer], squats.  Also dose some cardio.  Works out 4 times per week.  Endurance has improved.  Has not taken any medications yet today.    Echo 05/2021 EF 45% Echo 5/21  EF 45-50% cMRI 3/20 EF 44% No LGE.   Echo 1/20 read formally as 25-30% Likely closer to 35-40% Echo 09/19/2018 shows 50-55% (I felt, 40-45%)  MUGA 08/17/18 LVEF 40%, Limited exam because of overlying breast tissue expanders.  Echo 07/24/18 LVEF 40-45% [facility]) Echo 04/23/18 LVEF 55-60%, Mild/Mod MR [facility]) Echo 02/20/18 LVEF 55-60%, GLS -  17.7. [facility]) Echo 08/07/17 LVEF "normal"  Review of systems complete and found to be negative unless listed in HPI.   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     carvedilol (COREG) 6.25 MG tablet Take 1.5 tablets (9.375 mg total) by mouth 2 (two) times daily with a meal. 270 tablet 3   Cobalamin Combinations (VITAMIN B12-FOLIC ACID PO) Take 1 mL by mouth daily. Vitamin B12 1000                      Folic Acid 248 mg     Multiple Vitamin (MULTIVITAMIN) tablet Take 1 tablet by mouth daily.     Potassium Chloride ER 20 MEQ TBCR TAKE 1 TABLET BY MOUTH EVERY DAY 90 tablet 3   Probiotic Product (PROBIOTIC DAILY PO) Take 1 tablet by mouth daily. Alternate with Kombucha drink     sacubitril-valsartan (ENTRESTO) 97-103 MG Take 1 tablet by mouth 2 (two) times daily. 180 tablet 3   spironolactone (ALDACTONE) 25 MG tablet TAKE 1 TABLET BY MOUTH EVERY DAY 90 tablet 3   No current facility-administered medications for this encounter.   Allergies  Allergen Reactions   Adhesive [Tape] Other (See Comments)    Sensitive to skin Hyperfix (white tape)    Social History   Socioeconomic History   Marital status: Married    Spouse name: Not on file   Number of children: Not on file   Years of education: Not on file   Highest education level: Not on file  Occupational History   Not on file  Tobacco Use   Smoking status: Never   Smokeless tobacco: Never  Vaping Use   Vaping Use: Never used  Substance and Sexual Activity   Alcohol use: Not Currently    Comment: "maybe 2 times a month"   Drug use: No   Sexual activity: Yes  Other Topics Concern   Not on file  Social History Narrative   Not on file   Social Determinants of Health   Financial Resource Strain: Not on file  Food Insecurity: Not on file  Transportation Needs: Not on file  Physical Activity: Not on file  Stress: Not on file  Social Connections: Not on file  Intimate Partner Violence: Not on file    Family History  Problem Relation Age of Onset   Hypertension Mother     Heart attack Maternal Grandfather    Vitals:   06/23/21 1533  BP: (!) 158/90  Pulse: 89  SpO2: 98%  Weight: 72.1 kg (159 lb)   Wt Readings from Last 3 Encounters:  06/23/21 72.1 kg (159 lb)  05/28/20 70.3 kg (155 lb)  05/14/20 69.3 kg (152 lb 12.8 oz)    PHYSICAL EXAM: General:  Well appearing. No resp difficulty HEENT: normal Neck: supple. no JVD. Carotids 2+ bilat; no bruits. No lymphadenopathy or thryomegaly appreciated. Cor: PMI nondisplaced. Regular rate & rhythm. No rubs, gallops or murmurs. Lungs: clear Abdomen: soft, nontender, nondistended. No hepatosplenomegaly. No bruits or masses. Good bowel sounds. Extremities: no cyanosis, clubbing, rash, edema Neuro: alert &  orientedx3, cranial nerves grossly intact. moves all 4 extremities w/o difficulty. Affect pleasant   ASSESSMENT & PLAN:  1.  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 Salpinogo-oophorectomy 10/2020 - now on anti-estrogens - follows with Dr. Humphrey Rolls  2. Nonischemic CM - Echo 05/2021 EF 45% - suspect due to Adriamycin. EF now improved - NYHA I - Continue Entresto 97/103 bid - Increase carvedilol to 12.5 BID - Continue spiro 25 daily - Will consider starting SGLT2i at a future visit, she remains minimally symptomatic.  - Repeat labs today - Check Iron studies  3. HTN - Did not take her medications this morning and does not check at home, recent dermatology visit bp was 130/80 w/HR 86 should have plenty of room to increase carvedilol as outlined above   Katherine Roan, MD  3:58 PM   Patient seen and examined with the above-signed Advanced Practice Provider and/or Housestaff. I personally reviewed laboratory data, imaging studies and relevant notes. I independently examined the patient and formulated the important aspects of the plan. I have edited the note to reflect any of my changes or salient points. I have personally discussed the plan with the  patient and/or family.  Doing well. NYHA I. Volume status looks good. Echo reviewed personally EF ~45%. Reports good compliance with meds though she didn't take this am. BP high here.   General:  Well appearing. No resp difficulty HEENT: normal Neck: supple. no JVD. Carotids 2+ bilat; no bruits. No lymphadenopathy or thryomegaly appreciated. Cor: PMI nondisplaced. Regular rate & rhythm. No rubs, gallops or murmurs. Lungs: clear Abdomen: soft, nontender, nondistended. No hepatosplenomegaly. No bruits or masses. Good bowel sounds. Extremities: no cyanosis, clubbing, rash, edema Neuro: alert & orientedx3, cranial nerves grossly intact. moves all 4 extremities w/o difficulty. Affect pleasant  EF stable at ~45%. Suspect adriamycin CM. NYHA I. Will increase carvedilol to 12.5. Encouraged tight compliance with meds. Keep BP log. Consider SGLT2i at next visit.   Darlene Bickers, MD  11:14 PM

## 2021-07-05 ENCOUNTER — Other Ambulatory Visit (HOSPITAL_COMMUNITY): Payer: Self-pay | Admitting: Internal Medicine

## 2021-10-07 ENCOUNTER — Ambulatory Visit (HOSPITAL_COMMUNITY)
Admission: RE | Admit: 2021-10-07 | Discharge: 2021-10-07 | Disposition: A | Discharge status: Home / Self Care | Payer: Managed Care, Other (non HMO) | Source: Ambulatory Visit | Attending: Internal Medicine | Admitting: Internal Medicine

## 2021-10-07 ENCOUNTER — Encounter (HOSPITAL_COMMUNITY): Payer: Self-pay | Admitting: Internal Medicine

## 2021-10-07 ENCOUNTER — Other Ambulatory Visit (HOSPITAL_COMMUNITY): Payer: Self-pay

## 2021-10-07 VITALS — BP 126/88 | HR 87 | Wt 159.0 lb

## 2021-10-07 DIAGNOSIS — I110 Hypertensive heart disease with heart failure: Secondary | ICD-10-CM | POA: Insufficient documentation

## 2021-10-07 DIAGNOSIS — T451X5A Adverse effect of antineoplastic and immunosuppressive drugs, initial encounter: Secondary | ICD-10-CM | POA: Diagnosis not present

## 2021-10-07 DIAGNOSIS — C50612 Malignant neoplasm of axillary tail of left female breast: Secondary | ICD-10-CM | POA: Insufficient documentation

## 2021-10-07 DIAGNOSIS — I427 Cardiomyopathy due to drug and external agent: Secondary | ICD-10-CM | POA: Diagnosis not present

## 2021-10-07 DIAGNOSIS — I5022 Chronic systolic (congestive) heart failure: Secondary | ICD-10-CM | POA: Diagnosis not present

## 2021-10-07 DIAGNOSIS — Z79899 Other long term (current) drug therapy: Secondary | ICD-10-CM | POA: Diagnosis not present

## 2021-10-07 DIAGNOSIS — I428 Other cardiomyopathies: Secondary | ICD-10-CM | POA: Diagnosis not present

## 2021-10-07 DIAGNOSIS — Z79811 Long term (current) use of aromatase inhibitors: Secondary | ICD-10-CM | POA: Insufficient documentation

## 2021-10-07 DIAGNOSIS — I10 Essential (primary) hypertension: Secondary | ICD-10-CM

## 2021-10-07 DIAGNOSIS — C773 Secondary and unspecified malignant neoplasm of axilla and upper limb lymph nodes: Secondary | ICD-10-CM | POA: Diagnosis not present

## 2021-10-07 DIAGNOSIS — Z9221 Personal history of antineoplastic chemotherapy: Secondary | ICD-10-CM | POA: Insufficient documentation

## 2021-10-07 LAB — CBC
HCT: 34.9 % — ABNORMAL LOW (ref 36.0–46.0)
Hemoglobin: 11.9 g/dL — ABNORMAL LOW (ref 12.0–15.0)
MCH: 31.6 pg (ref 26.0–34.0)
MCHC: 34.1 g/dL (ref 30.0–36.0)
MCV: 92.6 fL (ref 80.0–100.0)
Platelets: 294 10*3/uL (ref 150–400)
RBC: 3.77 MIL/uL — ABNORMAL LOW (ref 3.87–5.11)
RDW: 12 % (ref 11.5–15.5)
WBC: 4.9 10*3/uL (ref 4.0–10.5)
nRBC: 0 % (ref 0.0–0.2)

## 2021-10-07 LAB — BASIC METABOLIC PANEL
Anion gap: 8 (ref 5–15)
BUN: 8 mg/dL (ref 6–20)
CO2: 25 mmol/L (ref 22–32)
Calcium: 9.3 mg/dL (ref 8.9–10.3)
Chloride: 103 mmol/L (ref 98–111)
Creatinine, Ser: 0.7 mg/dL (ref 0.44–1.00)
GFR, Estimated: >60 mL/min (ref >60)
Glucose, Bld: 96 mg/dL (ref 70–99)
Potassium: 4 mmol/L (ref 3.5–5.1)
Sodium: 136 mmol/L (ref 135–145)

## 2021-10-07 LAB — BRAIN NATRIURETIC PEPTIDE: B Natriuretic Peptide: 18.6 pg/mL (ref 0.0–100.0)

## 2021-10-07 MED ORDER — CARVEDILOL 25 MG PO TABS: 25.0000 mg | ORAL_TABLET | Freq: Two times a day (BID) | ORAL | 6 refills | Status: DC

## 2021-10-07 MED ORDER — EMPAGLIFLOZIN 10 MG PO TABS: 10.0000 mg | ORAL_TABLET | Freq: Every day | ORAL | 3 refills | Status: DC

## 2021-10-07 NOTE — Patient Instructions (Signed)
Your provider has prescribed Jardiance 10mg  Daily for you. Please be aware the most common side effect of this medication is urinary tract infections and yeast infections. Please practice good hygiene and keep this area clean and dry to help prevent this. If you do begin to have symptoms of these infections, such as difficulty urinating or painful urination,  please let us know.  Labs done today, your results will be available in MyChart, we will contact you for abnormal readings.  Your physician recommends that you schedule a follow-up appointment in: 4 months with Echocardiogram.  If you have any questions or concerns before your next appointment please send [place] a message through [place] or call our office at [phone].    TO LEAVE A MESSAGE FOR THE NURSE SELECT OPTION 2, PLEASE LEAVE A MESSAGE INCLUDING: YOUR NAME DATE OF BIRTH CALL BACK NUMBER REASON FOR CALL**this is important as we prioritize the call backs  YOU WILL RECEIVE A CALL BACK THE SAME DAY AS LONG AS YOU CALL BEFORE 4:00 PM  At the [facility], you and your health needs are our priority. As part of our continuing mission to provide you with exceptional heart care, we have created designated Provider Care Teams. These Care Teams include your primary Cardiologist (physician) and Advanced Practice Providers (APPs- Physician Assistants and Nurse Practitioners) who all work together to provide you with the care you need, when you need it.   You may see any of the following providers on your designated Care Team at your next follow up: Dr Glori Bickers Dr Haynes Kerns, NP Lyda Jester, [place] [facility] [place], [place] Audry Riles, PharmD   Please be sure to bring in all your medications bottles to every appointment.

## 2021-10-07 NOTE — Addendum Note (Signed)
Encounter addended by: Jerl Mina, RN on: 10/07/2021 11:09 AM  Actions taken: Charge Capture section accepted, Pend clinical note, Diagnosis association updated, Order list changed, Clinical Note Signed

## 2021-10-07 NOTE — Progress Notes (Signed)
Cardio-Oncology Clinic Note   Referring Physician: Dr. Bettina Gavia Primary Cardiologist: Dr. Bettina Gavia Primary Oncologist: Dr. Marcy Panning [facility])  HPI: Darlene Kline is a 40 y.o. 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 Bettina Gavia to establish in the cardio-oncology clinic for evaluation of possible chemotherapy cardiotoxicity.   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 There is a superficial irregular hypoechoic mass at the 3 o'clock position of the left breast 1 cm from the nipple measuring 0.7 x 1.3 x 1.7 cm located 1.2 cm from the larger mass. Ultrasound of the left axilla demonstrated an abnormal 1 cm node over the region with near complete replacement of the fatty hilum. There was also a little much larger abnormal lymph node higher in the left axilla with cortical thickness of 8 mm.   07/07/2017 Bone scan     No evident bony metastatic disease.    07/17/2017 US Biopsy    Invasive ductal carcinoma with rare foci suspicious for high-grade ductal carcinoma in situ. Biopsy of the left axillary lymph node was consistent with metastatic carcinoma with breast primary. 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 Prognostic panel testing revealed the tumor to be HER-2 positive.   4/4 - 03/28/18 Radiation    February 22, 2018 through Mar 28, 2018. Patient experienced localized skin reaction including desquamation in the upper outer quadrant treated with topical creams.       She denies any h/o known heart disease. Found to have left breast CA in 8/18. Initially HER2-neu negative. Completed initial therapy with Taxotere, Adriamycin, Cyclophosphamide beginning 08/25/17 - 12/08/2017 x 6 cycles. After surgery found to be HER2+ and started on Kadcyla in 6/19. Echo 6/19 reported 55-60%. Echo in 9/19 showed EF 40-45% [facility]). Kadcyla stopped for 3 months. Started on valsartan and Toprol.  EF subsequently dropped a bit more.   Finished Kadcyla in 2019 she received 12/15 doses. Felt to be in remission. Getting anti-estrogens only now.   Here for f/u. Exercising regularly with weights and cardio. Feels like she isn't getting as winded with exercise. No edema, orthopnea or PND. No problems with meds. Takes BP regularly SBP 92-120.     Echo 05/2021 EF 45% Echo 5/21  EF 45-50% cMRI 3/20 EF 44% No LGE.   Echo 1/20 read formally as 25-30% Likely closer to 35-40% Echo 09/19/2018 shows 50-55% (I felt, 40-45%)  MUGA 08/17/18 LVEF 40%, Limited exam because of overlying breast tissue expanders.  Echo 07/24/18 LVEF 40-45% [facility]) Echo 04/23/18 LVEF 55-60%, Mild/Mod MR [facility]) Echo 02/20/18 LVEF 55-60%, GLS -17.7. [facility]) Echo 08/07/17 LVEF "normal"  Review of systems complete and found to be negative unless listed in HPI.   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      carvedilol (COREG) 12.5 MG tablet Patient takes 2 tablets twice a day.     Cobalamin Combinations (VITAMIN B12-FOLIC ACID PO) Take 1 mL by mouth daily. Vitamin B12 1000                      Folic Acid 579 mg     Multiple Vitamin (MULTIVITAMIN) tablet Take 1 tablet by mouth daily.     Potassium Chloride ER 20 MEQ TBCR TAKE 1 TABLET BY MOUTH EVERY DAY 90 tablet 3   Probiotic Product (PROBIOTIC DAILY PO) Take 1 tablet by mouth daily. Alternate with Kombucha drink     sacubitril-valsartan (ENTRESTO) 97-103 MG Take 1 tablet by mouth 2 (two) times daily. 180 tablet 3   spironolactone (ALDACTONE) 25 MG tablet TAKE 1 TABLET BY MOUTH EVERY DAY 90 tablet 3   No current facility-administered medications for this encounter.   Allergies  Allergen Reactions   Adhesive [Tape] Other (See Comments)    Sensitive to skin Hyperfix (white tape)    Social History   Socioeconomic History   Marital status: Married    Spouse name: Not on file   Number of children: Not on file   Years of education: Not on file   Highest education level: Not on file  Occupational History   Not on file  Tobacco Use   Smoking status: Never   Smokeless tobacco: Never  Vaping Use   Vaping Use: Never used  Substance and Sexual Activity   Alcohol use: Not Currently    Comment: "maybe 2 times a month"   Drug use: No   Sexual activity: Yes  Other Topics Concern   Not on file  Social History Narrative   Not on file   Social Determinants of Health   Financial Resource Strain: Not on file  Food Insecurity: Not on file  Transportation Needs: Not on file  Physical Activity: Not on file  Stress: Not on file  Social Connections: Not on file  Intimate Partner Violence: Not on file    Family History  Problem Relation Age of Onset   Hypertension Mother    Heart attack Maternal Grandfather    Vitals:   10/07/21 1013  BP: 126/88  Pulse: 87  SpO2: 100%  Weight: 72.1 kg (159 lb)   Wt Readings from Last 3  Encounters:  10/07/21 72.1 kg (159 lb)  06/23/21 72.1 kg (159 lb)  05/28/20 70.3 kg (155 lb)    General:  Well appearing. No resp difficulty HEENT: normal Neck: supple. no JVD. Carotids 2+ bilat; no bruits. No lymphadenopathy or thryomegaly appreciated. Cor: PMI nondisplaced. Regular rate & rhythm. No rubs, gallops or murmurs. Lungs: clear Abdomen: soft, nontender, nondistended. No hepatosplenomegaly. No bruits or masses. Good bowel sounds. Extremities: no cyanosis, clubbing, rash, edema Neuro: alert & orientedx3, cranial nerves grossly intact. moves all 4 extremities w/o difficulty. Affect pleasant    ASSESSMENT & PLAN:  1.  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 Salpinogo-oophorectomy 10/2020 - now on anti-estrogens -  follows with Dr. Humphrey Rolls - doing well  2. Nonischemic CM - Echo 05/2021 EF 45% - suspect due to Adriamycin. EF now improved - NYHA I-II - Continue Entresto 97/103 bid - Continue carvedilol 12.5 BID - Continue spiro 25 daily - Will start Jardiance 10  - Return in 4 months with repeat echo  -   3. HTN - Blood pressure well controlled. Continue current regimen.  Glori Bickers, MD  10:37 AM

## 2021-10-18 ENCOUNTER — Encounter (HOSPITAL_BASED_OUTPATIENT_CLINIC_OR_DEPARTMENT_OTHER): Payer: Self-pay | Admitting: Plastic Surgery

## 2021-10-18 ENCOUNTER — Other Ambulatory Visit: Payer: Self-pay

## 2021-10-21 NOTE — Progress Notes (Signed)

## 2021-10-21 NOTE — H&P (Signed)
Subjective:     Patient ID: Darlene Kline is a 40 y.o. female.   Follow-up     3 years 47mopost bilateral mastectomies with immediate reconstruction. 3 years post implant exchange and left LD flap. Scheduled for left reconstruction revision. Oncology ordered MRI completed 10/20/21 benign. Patient reported last month present pimple like mass left axilla that she does not feel mass any longer.   Presented with palpable left breast mass. MMG revealed a mass in the left retroareolar region with microcalcifications extending from the mass to the base of the nipple spanning 4.8 cm from the posterior border of the mass to the nipple base. [place] an irregular mass at the 530 position of the left breast 2 cmfn measuring 1.8 x 2.9 x 3 cm with  duct extension to the nipple base.  An additional mass at 3 o'clock position of the left breast 1 cmfn measuring 0.7 x 1.3 x 1.7 cm located 1.2 cm from the larger mass.  [place] axilla demonstrated at least 2 abnormal LN.  Biopsy of 5:30 mass with IDC, ER/PR+, Her2 -. LN biopsy positive for metastatic disease.  invasive ductal carcinoma at the 530 o'clock position. Additional biopsy 2 o clock with UDH.    Genetics with VUS CHEK2. Staging scans negative for distant disease.   Neoadjuvant chemotherapy completed 1.18.19. Final MRI noted indeterminate 5 mm enhancing mass in UIQ RIGHT breast; second look [place]. Near complete resolution of the enhancing masses in the left breast and normal size of the abnormal left axillary LN.   Final pathology right benign, left largest focus residual IDC 0.3 cm, +4/13 LN, margins negative. Completed adjuvant radiation 5.13.19. Post BSO. On anastrozole, plan 10 years.   Completed Kadcyla 5/20. Hx cardiomyopathy follows with Dr. BHaroldine Laws - Echo 05/2021 EF 45%   CT and bone scans 06/2019 without evidence metastatic disease.    Prior 38 C, noted left larger than left and reports right "more like a B." Right mastectomy  351 g, left 538 g   Lives with husband and two kids. 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   Review of Systems      Objective:   Physical Exam  Cardiovascular: Normal rate, regular rhythm and normal heart sounds.  Pulmonary/Chest: Effort normal and breath sounds normal.    Chest:  right chest with minimal rippling when bending forward, left chest with more rippling noted when bending forward,  hyperpigmentation step off contour superior pole and lateral chest wall Right Baker 1 Left Baker 2/3 Left volume appears less than right SN to nipple R 22 L 22 (reconstructed) Nipple to IMF R 7 L 6.5 Axilla no masses palpable   Abd: striae over supra and infraumbilical abdomen with laxity skin, flanks with small donor site fat no striae in this area Thighs: lateral thighs without striae, striae present over medial and anterior thigh with adequate soft tissue donor site in all of these areas.   Assessment:     Left breast ca, metastatic to LN Neoadjuvant chemotherapy S/p Right NSM, left total mastectomy, left ALND, bilateral prepectoral TE/ADM reconstruction (Alloderm) Adjuvant radiation Recurrent left chest fluid collection, threatened ulceration S/p removal bilateral TE, placement silicone implants, left LD flap S/p left nipple reconstruction, lipofilling bilateral chest, removal port    Plan:    Plan revision left breast reconstruction with silicone implant exchange, fat grafting from abdomen to left chest, possible right chest.   Progression CC over left  chest. Discussed revision to larger implant volume. Counseled this would only offer more symmetry bra volume. This may improve some the step off lateral chest wall and superiorly but overall I expect continues significant asymmetries reconstruction given RT. Plan significant capsulectomy insetting CC and anticipate drain. Will plan change to Soft Touch implant in effort to reduce rippling.   Plan  additional fat grafting to left to aid with contour depressions. As we have discussed in past, her central abdomen has sufficient fat for donor site but with striae and lax skin in this area should not expect any contraction soft tissue and may lead to more redundancy skin. Plan flank donor site. Patient asking for additional fat grafting to right, desires "padding" to prevent rippling as she plans to lose weight. Counseled will inject to right chest if sufficient fat harvested, reviewed with weight loss the fat graft that is retained will lose volume.   Additional risks including but not limited to bleeding hematoma seroma infection need for additional procedures damage to adjacent structures recurrent contracture, blood clots in legs or lungs reviewed. Reviewed with patient she had threatened exposure device and latissmius flap completed in urgent fashion. Counseled any complications from this surgery such as infection extrusion or exposure could lead to failure of reconstruction.   Completed Herma Carson physician patient checklist.   Rx for oxycodone and Bactrim given.    Natrelle Inspira Smooth Round Extra Projection implants placed bilateral. RIGHT 470 ml, REF [id number] LEFT 400 ml  25 ml fat infiltrated over each chest

## 2021-10-22 ENCOUNTER — Encounter (HOSPITAL_BASED_OUTPATIENT_CLINIC_OR_DEPARTMENT_OTHER)
Admission: RE | Disposition: A | Discharge status: Home / Self Care | Payer: Self-pay | Source: Ambulatory Visit | Attending: Plastic Surgery

## 2021-10-22 ENCOUNTER — Other Ambulatory Visit: Payer: Self-pay

## 2021-10-22 ENCOUNTER — Encounter (HOSPITAL_BASED_OUTPATIENT_CLINIC_OR_DEPARTMENT_OTHER): Payer: Self-pay | Admitting: Plastic Surgery

## 2021-10-22 ENCOUNTER — Ambulatory Visit (HOSPITAL_BASED_OUTPATIENT_CLINIC_OR_DEPARTMENT_OTHER)
Admission: RE | Admit: 2021-10-22 | Discharge: 2021-10-22 | Disposition: A | Discharge status: Home / Self Care | Payer: Managed Care, Other (non HMO) | Source: Ambulatory Visit | Attending: Plastic Surgery | Admitting: Plastic Surgery

## 2021-10-22 ENCOUNTER — Ambulatory Visit (HOSPITAL_BASED_OUTPATIENT_CLINIC_OR_DEPARTMENT_OTHER): Payer: Managed Care, Other (non HMO) | Admitting: Anesthesiology

## 2021-10-22 DIAGNOSIS — C50912 Malignant neoplasm of unspecified site of left female breast: Secondary | ICD-10-CM | POA: Insufficient documentation

## 2021-10-22 HISTORY — PX: BREAST CAPSULECTOMY WITH IMPLANT EXCHANGE: SHX5592

## 2021-10-22 HISTORY — PX: LIPOSUCTION WITH LIPOFILLING: SHX6436

## 2021-10-22 LAB — POCT PREGNANCY, URINE: Preg Test, Ur: NEGATIVE

## 2021-10-22 SURGERY — CAPSULECTOMY, BREAST, WITH REPLACEMENT OF IMPLANT
Anesthesia: General | Site: Chest | Laterality: Left

## 2021-10-22 MED ORDER — CHLORHEXIDINE GLUCONATE CLOTH 2 % EX PADS: 6.0000 | MEDICATED_PAD | Freq: Once | CUTANEOUS | Status: DC

## 2021-10-22 MED ORDER — LIDOCAINE 2% (20 MG/ML) 5 ML SYRINGE
INTRAMUSCULAR | Status: AC
  Filled 2021-10-22: qty 5

## 2021-10-22 MED ORDER — GABAPENTIN 300 MG PO CAPS
300.0000 mg | ORAL_CAPSULE | ORAL | Status: AC
  Administered 2021-10-22: 300 mg via ORAL

## 2021-10-22 MED ORDER — SUCCINYLCHOLINE CHLORIDE 200 MG/10ML IV SOSY
PREFILLED_SYRINGE | INTRAVENOUS | Status: AC
  Filled 2021-10-22: qty 10

## 2021-10-22 MED ORDER — DEXAMETHASONE SODIUM PHOSPHATE 4 MG/ML IJ SOLN
INTRAMUSCULAR | Status: DC | PRN
  Administered 2021-10-22: 5 mg via INTRAVENOUS

## 2021-10-22 MED ORDER — CELECOXIB 200 MG PO CAPS
200.0000 mg | ORAL_CAPSULE | ORAL | Status: AC
  Administered 2021-10-22: 200 mg via ORAL

## 2021-10-22 MED ORDER — PROPOFOL 500 MG/50ML IV EMUL
INTRAVENOUS | Status: AC
  Filled 2021-10-22: qty 50

## 2021-10-22 MED ORDER — PHENYLEPHRINE 40 MCG/ML (10ML) SYRINGE FOR IV PUSH (FOR BLOOD PRESSURE SUPPORT)
PREFILLED_SYRINGE | INTRAVENOUS | Status: AC
  Filled 2021-10-22: qty 10

## 2021-10-22 MED ORDER — ATROPINE SULFATE 0.4 MG/ML IV SOLN
INTRAVENOUS | Status: AC
  Filled 2021-10-22: qty 1

## 2021-10-22 MED ORDER — SUGAMMADEX SODIUM 200 MG/2ML IV SOLN
INTRAVENOUS | Status: DC | PRN
  Administered 2021-10-22: 200 mg via INTRAVENOUS

## 2021-10-22 MED ORDER — PHENYLEPHRINE HCL (PRESSORS) 10 MG/ML IV SOLN
INTRAVENOUS | Status: DC | PRN
  Administered 2021-10-22: 80 ug via INTRAVENOUS

## 2021-10-22 MED ORDER — AMISULPRIDE (ANTIEMETIC) 5 MG/2ML IV SOLN
INTRAVENOUS | Status: AC
  Filled 2021-10-22: qty 2

## 2021-10-22 MED ORDER — DEXAMETHASONE SODIUM PHOSPHATE 10 MG/ML IJ SOLN
INTRAMUSCULAR | Status: AC
  Filled 2021-10-22: qty 1

## 2021-10-22 MED ORDER — AMISULPRIDE (ANTIEMETIC) 5 MG/2ML IV SOLN
10.0000 mg | Freq: Once | INTRAVENOUS | Status: AC
  Administered 2021-10-22: 10 mg via INTRAVENOUS

## 2021-10-22 MED ORDER — EPINEPHRINE PF 1 MG/ML IJ SOLN
INTRAMUSCULAR | Status: AC
  Filled 2021-10-22: qty 2

## 2021-10-22 MED ORDER — ACETAMINOPHEN 500 MG PO TABS
ORAL_TABLET | ORAL | Status: AC
  Filled 2021-10-22: qty 2

## 2021-10-22 MED ORDER — CEFAZOLIN SODIUM-DEXTROSE 2-4 GM/100ML-% IV SOLN
2.0000 g | INTRAVENOUS | Status: AC
  Administered 2021-10-22: 2 g via INTRAVENOUS

## 2021-10-22 MED ORDER — MIDAZOLAM HCL 5 MG/5ML IJ SOLN
INTRAMUSCULAR | Status: DC | PRN
  Administered 2021-10-22: 2 mg via INTRAVENOUS

## 2021-10-22 MED ORDER — EPHEDRINE SULFATE 50 MG/ML IJ SOLN
INTRAMUSCULAR | Status: DC | PRN
  Administered 2021-10-22: 10 mg via INTRAVENOUS

## 2021-10-22 MED ORDER — CELECOXIB 200 MG PO CAPS
ORAL_CAPSULE | ORAL | Status: AC
  Filled 2021-10-22: qty 1

## 2021-10-22 MED ORDER — FENTANYL CITRATE (PF) 100 MCG/2ML IJ SOLN
25.0000 ug | INTRAMUSCULAR | Status: DC | PRN
  Administered 2021-10-22 (×4): 25 ug via INTRAVENOUS

## 2021-10-22 MED ORDER — LACTATED RINGERS IV SOLN: INTRAVENOUS | Status: DC

## 2021-10-22 MED ORDER — FENTANYL CITRATE (PF) 100 MCG/2ML IJ SOLN
INTRAMUSCULAR | Status: AC
  Filled 2021-10-22: qty 2

## 2021-10-22 MED ORDER — PROPOFOL 10 MG/ML IV BOLUS
INTRAVENOUS | Status: DC | PRN
  Administered 2021-10-22: 150 mg via INTRAVENOUS

## 2021-10-22 MED ORDER — EPHEDRINE 5 MG/ML INJ
INTRAVENOUS | Status: AC
  Filled 2021-10-22: qty 5

## 2021-10-22 MED ORDER — FENTANYL CITRATE (PF) 100 MCG/2ML IJ SOLN: INTRAMUSCULAR | Status: DC | PRN

## 2021-10-22 MED ORDER — ROCURONIUM BROMIDE 10 MG/ML (PF) SYRINGE
PREFILLED_SYRINGE | INTRAVENOUS | Status: AC
  Filled 2021-10-22: qty 10

## 2021-10-22 MED ORDER — SODIUM BICARBONATE 4.2 % IV SOLN
INTRAVENOUS | Status: AC
  Filled 2021-10-22: qty 20

## 2021-10-22 MED ORDER — SODIUM CHLORIDE 0.9 % IV SOLN
INTRAVENOUS | Status: AC
  Filled 2021-10-22 (×2): qty 10

## 2021-10-22 MED ORDER — MIDAZOLAM HCL 2 MG/2ML IJ SOLN
INTRAMUSCULAR | Status: AC
  Filled 2021-10-22: qty 2

## 2021-10-22 MED ORDER — ONDANSETRON HCL 4 MG/2ML IJ SOLN
INTRAMUSCULAR | Status: AC
  Filled 2021-10-22: qty 2

## 2021-10-22 MED ORDER — BUPIVACAINE-EPINEPHRINE (PF) 0.25% -1:200000 IJ SOLN
INTRAMUSCULAR | Status: AC
  Filled 2021-10-22: qty 90

## 2021-10-22 MED ORDER — PHENYLEPHRINE HCL (PRESSORS) 10 MG/ML IV SOLN
INTRAVENOUS | Status: AC
  Filled 2021-10-22: qty 1

## 2021-10-22 MED ORDER — ACETAMINOPHEN 500 MG PO TABS
1000.0000 mg | ORAL_TABLET | ORAL | Status: AC
  Administered 2021-10-22: 1000 mg via ORAL

## 2021-10-22 MED ORDER — GABAPENTIN 300 MG PO CAPS
ORAL_CAPSULE | ORAL | Status: AC
  Filled 2021-10-22: qty 1

## 2021-10-22 MED ORDER — LIDOCAINE HCL (CARDIAC) PF 100 MG/5ML IV SOSY
PREFILLED_SYRINGE | INTRAVENOUS | Status: DC | PRN
  Administered 2021-10-22: 40 mg via INTRAVENOUS

## 2021-10-22 MED ORDER — FENTANYL CITRATE (PF) 100 MCG/2ML IJ SOLN
INTRAMUSCULAR | Status: DC | PRN
  Administered 2021-10-22 (×2): 50 ug via INTRAVENOUS

## 2021-10-22 MED ORDER — PHENYLEPHRINE HCL-NACL 20-0.9 MG/250ML-% IV SOLN
INTRAVENOUS | Status: DC | PRN
  Administered 2021-10-22: 50 ug/min via INTRAVENOUS

## 2021-10-22 MED ORDER — SODIUM BICARBONATE 4.2 % IV SOLN
INTRAVENOUS | Status: DC | PRN
  Administered 2021-10-22: 400 mL via INTRAMUSCULAR

## 2021-10-22 MED ORDER — CEFAZOLIN SODIUM-DEXTROSE 2-4 GM/100ML-% IV SOLN
INTRAVENOUS | Status: AC
  Filled 2021-10-22: qty 100

## 2021-10-22 MED ORDER — LIDOCAINE HCL (PF) 1 % IJ SOLN
INTRAMUSCULAR | Status: AC
  Filled 2021-10-22: qty 120

## 2021-10-22 MED ORDER — BUPIVACAINE-EPINEPHRINE 0.25% -1:200000 IJ SOLN
INTRAMUSCULAR | Status: DC | PRN
  Administered 2021-10-22: 20 mL

## 2021-10-22 MED ORDER — SODIUM CHLORIDE 0.9 % IV SOLN
INTRAVENOUS | Status: DC | PRN
  Administered 2021-10-22: 500 mL

## 2021-10-22 MED ORDER — ROCURONIUM BROMIDE 100 MG/10ML IV SOLN
INTRAVENOUS | Status: DC | PRN
  Administered 2021-10-22: 70 mg via INTRAVENOUS

## 2021-10-22 SURGICAL SUPPLY — 64 items
ADH SKN CLS APL DERMABOND .7 (GAUZE/BANDAGES/DRESSINGS) ×4
APL PRP STRL LF DISP 70% ISPRP (MISCELLANEOUS) ×4
BAG DECANTER FOR FLEXI CONT (MISCELLANEOUS) ×3 IMPLANT
BINDER ABDOMINAL 10 UNV 27-48 (MISCELLANEOUS) ×3 IMPLANT
BINDER BREAST LRG (GAUZE/BANDAGES/DRESSINGS) ×3 IMPLANT
BLADE SURG 10 STRL SS (BLADE) ×3 IMPLANT
BLADE SURG 11 STRL SS (BLADE) ×3 IMPLANT
BNDG GAUZE ELAST 4 BULKY (GAUZE/BANDAGES/DRESSINGS) ×6 IMPLANT
CANISTER LIPO FAT HARVEST (MISCELLANEOUS) ×3 IMPLANT
CANISTER SUCT 1200ML W/VALVE (MISCELLANEOUS) ×3 IMPLANT
CHLORAPREP W/TINT 26 (MISCELLANEOUS) ×6 IMPLANT
COVER BACK TABLE 60X90IN (DRAPES) ×3 IMPLANT
COVER MAYO STAND STRL (DRAPES) ×6 IMPLANT
DERMABOND ADVANCED (GAUZE/BANDAGES/DRESSINGS) ×2
DERMABOND ADVANCED .7 DNX12 (GAUZE/BANDAGES/DRESSINGS) ×4 IMPLANT
DRAPE TOP ARMCOVERS (MISCELLANEOUS) ×3 IMPLANT
DRAPE U-SHAPE 76X120 STRL (DRAPES) ×3 IMPLANT
DRAPE UTILITY XL STRL (DRAPES) ×3 IMPLANT
DRSG PAD ABDOMINAL 8X10 ST (GAUZE/BANDAGES/DRESSINGS) ×9 IMPLANT
ELECT BLADE 4.0 EZ CLEAN MEGAD (MISCELLANEOUS) ×3
ELECT COATED BLADE 2.86 ST (ELECTRODE) ×3 IMPLANT
ELECT REM PT RETURN 9FT ADLT (ELECTROSURGICAL) ×3
ELECTRODE BLDE 4.0 EZ CLN MEGD (MISCELLANEOUS) ×2 IMPLANT
ELECTRODE REM PT RTRN 9FT ADLT (ELECTROSURGICAL) ×2 IMPLANT
GLOVE SURG HYDRASOFT LTX SZ5.5 (GLOVE) ×3 IMPLANT
GOWN STRL REUS W/ TWL LRG LVL3 (GOWN DISPOSABLE) ×4 IMPLANT
GOWN STRL REUS W/TWL LRG LVL3 (GOWN DISPOSABLE) ×6
IMPL BREAST P6.1XRND LO 495 (Breast) ×2 IMPLANT
IMPL BRST P6.1XRND LO 495CC (Breast) ×2 IMPLANT
IMPLANT BREAST GEL 495CC (Breast) ×3 IMPLANT
LINER CANISTER 1000CC FLEX (MISCELLANEOUS) ×3 IMPLANT
NDL SAFETY ECLIPSE 18X1.5 (NEEDLE) ×2 IMPLANT
NEEDLE FILTER BLUNT 18X 1/2SAF (NEEDLE) ×1
NEEDLE FILTER BLUNT 18X1 1/2 (NEEDLE) ×2 IMPLANT
NEEDLE HYPO 18GX1.5 SHARP (NEEDLE) ×3
NEEDLE HYPO 25X1 1.5 SAFETY (NEEDLE) ×3 IMPLANT
NS IRRIG 1000ML POUR BTL (IV SOLUTION) ×3 IMPLANT
PACK BASIN DAY SURGERY FS (CUSTOM PROCEDURE TRAY) ×3 IMPLANT
PAD ALCOHOL SWAB (MISCELLANEOUS) ×3 IMPLANT
PENCIL SMOKE EVACUATOR (MISCELLANEOUS) ×3 IMPLANT
SHEET MEDIUM DRAPE 40X70 STRL (DRAPES) ×6 IMPLANT
SIZER BREAST 470CC (SIZER) ×3
SIZER BREAST 495CC (SIZER) ×3
SIZER BRST 495CC (SIZER) ×2 IMPLANT
SIZER BRST P6.1X 470CC (SIZER) ×2 IMPLANT
SLEEVE SCD COMPRESS KNEE MED (STOCKING) ×3 IMPLANT
SPONGE T-LAP 18X18 ~~LOC~~+RFID (SPONGE) ×6 IMPLANT
STAPLER VISISTAT 35W (STAPLE) ×3 IMPLANT
SUT ETHILON 2 0 FS 18 (SUTURE) IMPLANT
SUT MNCRL AB 4-0 PS2 18 (SUTURE) ×3 IMPLANT
SUT VIC AB 3-0 SH 27 (SUTURE) ×3
SUT VIC AB 3-0 SH 27X BRD (SUTURE) ×2 IMPLANT
SUT VICRYL 4-0 PS2 18IN ABS (SUTURE) ×3 IMPLANT
SYR 10ML LL (SYRINGE) ×9 IMPLANT
SYR 50ML LL SCALE MARK (SYRINGE) ×6 IMPLANT
SYR BULB IRRIG 60ML STRL (SYRINGE) ×3 IMPLANT
SYR CONTROL 10ML LL (SYRINGE) ×3 IMPLANT
SYR TB 1ML LL NO SAFETY (SYRINGE) ×3 IMPLANT
TOWEL GREEN STERILE FF (TOWEL DISPOSABLE) ×6 IMPLANT
TUBE CONNECTING 20X1/4 (TUBING) ×6 IMPLANT
TUBING INFILTRATION IT-10001 (TUBING) ×3 IMPLANT
TUBING SET GRADUATE ASPIR 12FT (MISCELLANEOUS) ×3 IMPLANT
UNDERPAD 30X36 HEAVY ABSORB (UNDERPADS AND DIAPERS) ×6 IMPLANT
YANKAUER SUCT BULB TIP NO VENT (SUCTIONS) ×3 IMPLANT

## 2021-10-22 NOTE — Anesthesia Postprocedure Evaluation (Signed)
Anesthesia Post Note  Patient: Darlene Kline  Procedure(s) Performed: REVISION LEFT BREAST RECONSTRUCTION WITH SILICONE BREAST IMPLANT EXCHANGE, CAPSULECTOMY (Left: Breast) FAT GRAFTING TO BILATERAL CHEST (Bilateral: Chest)     Patient location during evaluation: PACU Anesthesia Type: General Level of consciousness: awake Pain management: pain level controlled Vital Signs Assessment: post-procedure vital signs reviewed and stable Respiratory status: spontaneous breathing Cardiovascular status: stable Postop Assessment: no apparent nausea or vomiting Anesthetic complications: no   No notable events documented.  Last Vitals:  Vitals:   10/22/21 0650 10/22/21 0948  BP: 103/70 131/83  Pulse: 95 93  Resp: 18 (!) 29  Temp: 36.7 C 36.4 C  SpO2: 100% 99%    Last Pain:  Vitals:   10/22/21 0948  TempSrc:   PainSc: 8                  Saralee Bolick

## 2021-10-22 NOTE — Op Note (Signed)
Operative Note   DATE OF OPERATION: 12.2.22  LOCATION: [facility]-outpatient  SURGICAL DIVISION: Plastic Surgery  PREOPERATIVE DIAGNOSES:  1. History breast cancer 2. Acquired absence breasts 3. History therapeutic radiation  POSTOPERATIVE DIAGNOSES:  same  PROCEDURE:  1. Revision left breast reconstruction with silicone implant exchange partial capsulectomy 2. Lipofilling to bilateral chest 90 ml.  SURGEON: Irene Limbo MD MBA  ASSISTANT: none  ANESTHESIA:  General.   EBL: 15 ml  COMPLICATIONS: None immediate.   INDICATIONS FOR PROCEDURE:  The patient, Jacquelinne, is a 40 y.o. female born on 1981/04/30, is here for revision left breast reconstruction following latissimus flap and implant based reconstruction.   FINDINGS: Removed intact smooth round silicone implant. Placed Natrelle Soft Touch 495 ml Extra Profile implant, REF [id number]. Total 30 ml fat infiltrated right upper chest, 60 ml fat over left chest total mastectomy envelope.  DESCRIPTION OF PROCEDURE:  The patient's operative site was marked with the patient in the preoperative area including bilateral flanks for donor site. The patient was taken to the operating room. SCDs were placed and IV antibiotics were given. The patient's operative site was prepped and draped in a sterile fashion. A time out was performed and all information was confirmed to be correct.  Incision made in left inframammary fold and carried through superficial fascia and latissimus muscle. Intact implant removed. Examination of capsule showed thin capsule over majority. Capsulotomies performed circumferentially. Strip of capsule excised lateral border. Additional scoring of anterolateral capsule performed. Sizer placed. Patient brought to upright sitting position and assessed for symmetry. An extra Profile 495 ml implant selected for placement.  Patient returned to supine position. Stab incision made over lateral abdomen bilateral  in prior scars. Tumescent fluid infiltrated over bilateral flanks, total 400 ml tumescent infiltrated. Power assisted liposuction performed to endpoint symmetric contour and soft tissue thickness, total lipoaspirate 150 ml. The fat was then washed and prepared by gravity for infiltration. Harvested fat was then infiltrated in subcutaneous plane throughout total envelope left  mastectomy flaps. Stab incision made in prior scar right upper outer chest and 30 ml fat infiltrated over right chest upper pole. This incision closed with simple 4-0 monocryl.  Cavity irrigated with saline solution containing Ancef, gentamicin, and Betadine. Over left chest, local anesthetic infiltrated. Hemostasis ensured. Implant placed in cavity. Orientation implant ensured. Closure completed with 3-0 vicryl for approximation superficial fascia and latissimus muscle, 4-0 vicryl in dermis, 4-0 monocryl subcuticular closure. Tissue adhesive applied to left chest incision followed by dry dressing. Abdominal incisions closed with simple interrupted 4-0 monocryl. Breast binder and abdominal binder applied.  The patient was allowed to wake from anesthesia, extubated and taken to the recovery room in satisfactory condition.   SPECIMENS: none  DRAINS: none

## 2021-10-22 NOTE — Anesthesia Preprocedure Evaluation (Signed)
Anesthesia Evaluation  Patient identified by MRN, date of birth, ID band Patient awake    Reviewed: Allergy & Precautions, NPO status , Patient's Chart, lab work & pertinent test results  Airway Mallampati: II       Dental   Pulmonary neg pulmonary ROS,    breath sounds clear to auscultation       Cardiovascular hypertension, +CHF   Rhythm:Regular Rate:Normal     Neuro/Psych negative neurological ROS     GI/Hepatic Neg liver ROS, GERD  ,  Endo/Other  negative endocrine ROS  Renal/GU negative Renal ROS     Musculoskeletal   Abdominal   Peds  Hematology   Anesthesia Other Findings   Reproductive/Obstetrics                             Anesthesia Physical Anesthesia Plan  ASA: 3  Anesthesia Plan: General   Post-op Pain Management:    Induction:   PONV Risk Score and Plan: 3 and Ondansetron, Dexamethasone, Midazolam and Treatment may vary due to age or medical condition  Airway Management Planned: Oral ETT  Additional Equipment:   Intra-op Plan:   Post-operative Plan:   Informed Consent: I have reviewed the patients History and Physical, chart, labs and discussed the procedure including the risks, benefits and alternatives for the proposed anesthesia with the patient or authorized representative who has indicated his/her understanding and acceptance.     Dental advisory given  Plan Discussed with: CRNA and Anesthesiologist  Anesthesia Plan Comments:         Anesthesia Quick Evaluation

## 2021-10-22 NOTE — Interval H&P Note (Signed)
History and Physical Interval Note:  10/22/2021 6:55 AM  Darlene Kline  has presented today for surgery, with the diagnosis of history breast cancer, acquired absence breast, history therapeutic radiation.  The various methods of treatment have been discussed with the patient and family. After consideration of risks, benefits and other options for treatment, the patient has consented to  Procedure(s): REVISION LEFT BREAST RECONSTRUCTION WITH SILICONE BREAST IMPLANT EXCHANGE, CAPSULECTOMY (Left) FAT GRAFTING TO LEFT CHEST (Left) possible right chest as a surgical intervention.  The patient's history has been reviewed, patient examined, no change in status, stable for surgery.  I have reviewed the patient's chart and labs.  Questions were answered to the patient's satisfaction.     Arnoldo Hooker Kemaria Dedic

## 2021-10-22 NOTE — Anesthesia Procedure Notes (Signed)

## 2021-10-22 NOTE — Transfer of Care (Signed)
Immediate Anesthesia Transfer of Care Note  Patient: Darlene Kline  Procedure(s) Performed: REVISION LEFT BREAST RECONSTRUCTION WITH SILICONE BREAST IMPLANT EXCHANGE, CAPSULECTOMY (Left: Breast) FAT GRAFTING TO BILATERAL CHEST (Bilateral: Chest)  Patient Location: PACU  Anesthesia Type:General  Level of Consciousness: awake, alert , oriented, drowsy and patient cooperative  Airway & Oxygen Therapy: Patient Spontanous Breathing and Patient connected to face mask oxygen  Post-op Assessment: Report given to RN and Post -op Vital signs reviewed and stable  Post vital signs: Reviewed and stable  Last Vitals:  Vitals Value Taken Time  BP    Temp    Pulse 93 10/22/21 0947  Resp 29 10/22/21 0947  SpO2 99 % 10/22/21 0947  Vitals shown include unvalidated device data.  Last Pain:  Vitals:   10/22/21 0650  TempSrc: Oral  PainSc: 0-No pain         Complications: No notable events documented.

## 2021-10-22 NOTE — Discharge Instructions (Signed)
No Tylenol or ibuprofen until after 1pm today if needed  Post Anesthesia Home Care Instructions  Activity: Get plenty of rest for the remainder of the day. A responsible individual must stay with you for 24 hours following the procedure.  For the next 24 hours, DO NOT: -Drive a car -Paediatric nurse -Drink alcoholic beverages -Take any medication unless instructed by your physician -Make any legal decisions or sign important papers.  Meals: Start with liquid foods such as gelatin or soup. Progress to regular foods as tolerated. Avoid greasy, spicy, heavy foods. If nausea and/or vomiting occur, drink only clear liquids until the nausea and/or vomiting subsides. Call your physician if vomiting continues.  Special Instructions/Symptoms: Your throat may feel dry or sore from the anesthesia or the breathing tube placed in your throat during surgery. If this causes discomfort, gargle with warm salt water. The discomfort should disappear within 24 hours.  If you had a scopolamine patch placed behind your ear for the management of post- operative nausea and/or vomiting:  1. The medication in the patch is effective for 72 hours, after which it should be removed.  Wrap patch in a tissue and discard in the trash. Wash hands thoroughly with soap and water. 2. You may remove the patch earlier than 72 hours if you experience unpleasant side effects which may include dry mouth, dizziness or visual disturbances. 3. Avoid touching the patch. Wash your hands with soap and water after contact with the patch.

## 2021-10-23 ENCOUNTER — Encounter (HOSPITAL_BASED_OUTPATIENT_CLINIC_OR_DEPARTMENT_OTHER): Payer: Self-pay | Admitting: Plastic Surgery

## 2022-01-10 ENCOUNTER — Other Ambulatory Visit (HOSPITAL_COMMUNITY): Payer: Self-pay

## 2022-01-10 ENCOUNTER — Telehealth (HOSPITAL_COMMUNITY): Payer: Self-pay | Admitting: Pharmacy Technician

## 2022-01-10 NOTE — Telephone Encounter (Signed)
Advanced Heart Failure Patient Advocate Encounter  Patient called in stating that she could not afford her Entresto. It sounds like they were not attaching the copay card. Called and left the patient a message. Sent her a copy of the copay card from last year that was on file.  Charlann Boxer, CPhT

## 2022-01-12 ENCOUNTER — Other Ambulatory Visit (HOSPITAL_COMMUNITY): Payer: Self-pay

## 2022-01-12 ENCOUNTER — Other Ambulatory Visit (HOSPITAL_COMMUNITY): Payer: Self-pay | Admitting: Internal Medicine

## 2022-01-12 ENCOUNTER — Other Ambulatory Visit (HOSPITAL_COMMUNITY): Payer: Self-pay | Admitting: *Deleted

## 2022-01-12 MED ORDER — ENTRESTO 97-103 MG PO TABS: 1.0000 | ORAL_TABLET | Freq: Two times a day (BID) | ORAL | 3 refills | Status: DC

## 2022-01-12 NOTE — Telephone Encounter (Signed)
Advanced Heart Failure Patient Advocate Encounter  Patient called back requesting help with Jardiance, not Entresto. She has a high deductible plan so her Darlene Kline co-pay would be high even with a co-pay card (insurance also requires 90 day supplies). I think she is also going to be over the income for patient assistance but will attempt to get an approval.   Will also provide a month of Jardiance samples. I encouraged her to fill a 90 day supply of Entresto as soon as she can. This will help bring down her deductible but this will likely be an ongoing issue for the next 6 months until it is met.   Sent [employer] application via docusign for signature.

## 2022-01-29 ENCOUNTER — Other Ambulatory Visit (HOSPITAL_COMMUNITY): Payer: Self-pay | Admitting: Internal Medicine

## 2022-01-31 ENCOUNTER — Other Ambulatory Visit (HOSPITAL_COMMUNITY): Payer: Self-pay | Admitting: Internal Medicine

## 2022-02-06 NOTE — Progress Notes (Signed)
?Cardio-Oncology Clinic Note  ? ?Referring Physician: Dr. Bettina Gavia ?Primary Cardiologist: Dr. Bettina Gavia ?Primary Oncologist: Dr. Marcy Panning [facility]) ? ?HPI: ?Darlene Kline is a 41 y.o. 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 Bettina Gavia to establish in the cardio-oncology clinic for evaluation of possible chemotherapy cardiotoxicity.  ? ?She denies any h/o known heart disease. Found to have left breast CA in 8/18 with mets to left axillary LN.. Initially HER2-neu negative. Completed initial therapy with Taxotere, Adriamycin, Cyclophosphamide beginning 08/25/17 - 12/08/2017 x 6 cycles. After surgery found to be HER2+ and started on Kadcyla in 6/19. Echo 6/19 reported 55-60%. Echo in 9/19 showed EF 40-45% [facility]). Kadcyla stopped for 3 months. Started on valsartan and Toprol.  EF subsequently dropped a bit more.  ? ?Finished Kadcyla in 2019 she received 12/15 doses. Felt to be in remission. Getting anti-estrogens only now.  ? ?Here for f/u. Stays active with weights and cardio. 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 Denies CP or SOB.  ? ?Echo today 02/07/22 EF 50% Personally reviewed ? ? ?Echo 05/2021 EF 45% ?Echo 5/21  EF 45-50% ?cMRI 3/20 EF 44% No LGE.  ? ?Echo 1/20 read formally as 25-30% Likely closer to 35-40% ?Echo 09/19/2018 shows 50-55% (I felt, 40-45%) ? ?MUGA 08/17/18 LVEF 40%, Limited exam because of overlying breast tissue expanders.  ?Echo 07/24/18 LVEF 40-45% [facility]) ?Echo 04/23/18 LVEF 55-60%, Mild/Mod MR [facility]) ?Echo 02/20/18 LVEF 55-60%, GLS -17.7. [facility]) ?Echo 08/07/17 LVEF "normal" ? ?Review of systems complete and found to be negative unless listed in HPI.  ? ?Past Medical History:  ?Diagnosis Date  ? Anemia   ? off and on d/t chemo  ? Breast cancer, left breast ([place])   ? CHF (congestive heart failure) ([place])   ? High blood pressure due to drug   ? elevated BP d/t chemo, no longer elevated  after chemo  ? ?Current Outpatient Medications  ?Medication Sig Dispense Refill  ? anastrozole (ARIMIDEX) 1 MG tablet Take 1 mg by mouth daily.    ? carvedilol (COREG) 25 MG tablet Take 1 tablet (25 mg total) by mouth 2 (two) times daily with a meal. 60 tablet 1  ? empagliflozin (JARDIANCE) 10 MG TABS tablet Take 1 tablet (10 mg total) by mouth daily before breakfast. 90 tablet 3  ? Multiple Vitamin (MULTIVITAMIN) tablet Take 1 tablet by mouth daily.    ? Potassium Chloride ER 20 MEQ TBCR TAKE 1 TABLET BY MOUTH EVERY DAY 90 tablet 3  ? Probiotic Product (PROBIOTIC DAILY PO) Take 1 tablet by mouth daily. Alternate with Kombucha drink    ? sacubitril-valsartan (ENTRESTO) 97-103 MG Take 1 tablet by mouth 2 (two) times daily. 180 tablet 3  ? spironolactone (ALDACTONE) 25 MG tablet TAKE 1 TABLET BY MOUTH EVERY DAY 90 tablet 3  ? ?No current facility-administered medications for this encounter.  ? ?Allergies  ?Allergen Reactions  ? Adhesive [Tape] Other (See Comments)  ?  Sensitive to skin ?Hyperfix (white tape)   ? ?Social History  ? ?Socioeconomic History  ? Marital status: Married  ?  Spouse name: Not on file  ? Number of children: Not on file  ? Years of education: Not on file  ? Highest education level: Not on file  ?Occupational History  ? Not on file  ?Tobacco Use  ? Smoking status: Never  ? Smokeless tobacco: Never  ?Vaping Use  ?  Vaping Use: Never used  ?Substance and Sexual Activity  ? Alcohol use: Not Currently  ?  Comment: "maybe 2 times a month"  ? Drug use: No  ? Sexual activity: Yes  ?Other Topics Concern  ? Not on file  ?Social History Narrative  ? Not on file  ? ?Social Determinants of Health  ? ?Financial Resource Strain: Not on file  ?Food Insecurity: Not on file  ?Transportation Needs: Not on file  ?Physical Activity: Not on file  ?Stress: Not on file  ?Social Connections: Not on file  ?Intimate Partner Violence: Not on file  ?  ?Family History  ?Problem Relation Age of Onset  ? Hypertension Mother   ?  Heart attack Maternal Grandfather   ? ?Vitals:  ? 02/07/22 1011  ?BP: 116/80  ?Pulse: 70  ?SpO2: 100%  ?Weight: 73.8 kg (162 lb 12.8 oz)  ? ? ?Wt Readings from Last 3 Encounters:  ?02/07/22 73.8 kg (162 lb 12.8 oz)  ?10/22/21 72.6 kg (160 lb 0.9 oz)  ?10/07/21 72.1 kg (159 lb)  ?  ?Physical exam: ?General:  Well appearing. No resp difficulty ?HEENT: normal ?Neck: supple. no JVD. Carotids 2+ bilat; no bruits. No lymphadenopathy or thryomegaly appreciated. ?Cor: PMI nondisplaced. Regular rate & rhythm. No rubs, gallops or murmurs. ?Lungs: clear ?Abdomen: soft, nontender, nondistended. No hepatosplenomegaly. No bruits or masses. Good bowel sounds. ?Extremities: no cyanosis, clubbing, rash, edema ?Neuro: alert & orientedx3, cranial nerves grossly intact. moves all 4 extremities w/o difficulty. Affect pleasant ? ? ?ASSESSMENT & PLAN: ? ?1.  Carcinoma of L breast metastatic to axillary lymph node ?- has comlpleted chemo and Kadcyla. [place]-oophorectomy 10/2020 ?- now on anti-estrogens ?- follows with Dr. Humphrey Rolls ?- stable ? ?2. Nonischemic CM ?- Echo 1/20 read formally as 25-30% Likely closer to 35-40% ?- cMRI 3/20 EF 44% No LGE.  ?- Echo 05/2021 EF 45% ?- Echo today 02/07/22 EF 50% ?- suspect due to Adriamycin.  ?- NYHA I ?- Continue Entresto 97/103 bid ?- Continue carvedilol 12.5 BID ?- Continue spiro 25 daily ?- Continue Jardiance 10 ?- Repeat echo 1 year ? ?3. HTN ?- Blood pressure well controlled. Continue current regimen. ? ?Glori Bickers, MD  ?10:33 AM  ? ? ?

## 2022-02-07 ENCOUNTER — Ambulatory Visit (HOSPITAL_BASED_OUTPATIENT_CLINIC_OR_DEPARTMENT_OTHER)
Admission: RE | Admit: 2022-02-07 | Discharge: 2022-02-07 | Disposition: A | Discharge status: Home / Self Care | Payer: Managed Care, Other (non HMO) | Source: Ambulatory Visit

## 2022-02-07 ENCOUNTER — Other Ambulatory Visit: Payer: Self-pay

## 2022-02-07 ENCOUNTER — Encounter (HOSPITAL_COMMUNITY): Payer: Self-pay | Admitting: Internal Medicine

## 2022-02-07 ENCOUNTER — Ambulatory Visit (HOSPITAL_COMMUNITY)
Admission: RE | Admit: 2022-02-07 | Discharge: 2022-02-07 | Disposition: A | Discharge status: Home / Self Care | Payer: Managed Care, Other (non HMO) | Source: Ambulatory Visit | Attending: Internal Medicine | Admitting: Internal Medicine

## 2022-02-07 VITALS — BP 116/80 | HR 70 | Wt 162.8 lb

## 2022-02-07 DIAGNOSIS — Z7901 Long term (current) use of anticoagulants: Secondary | ICD-10-CM | POA: Diagnosis not present

## 2022-02-07 DIAGNOSIS — I427 Cardiomyopathy due to drug and external agent: Secondary | ICD-10-CM | POA: Diagnosis not present

## 2022-02-07 DIAGNOSIS — C50911 Malignant neoplasm of unspecified site of right female breast: Secondary | ICD-10-CM | POA: Diagnosis not present

## 2022-02-07 DIAGNOSIS — C50912 Malignant neoplasm of unspecified site of left female breast: Secondary | ICD-10-CM | POA: Insufficient documentation

## 2022-02-07 DIAGNOSIS — Z7984 Long term (current) use of oral hypoglycemic drugs: Secondary | ICD-10-CM | POA: Insufficient documentation

## 2022-02-07 DIAGNOSIS — C773 Secondary and unspecified malignant neoplasm of axilla and upper limb lymph nodes: Secondary | ICD-10-CM | POA: Diagnosis not present

## 2022-02-07 DIAGNOSIS — I110 Hypertensive heart disease with heart failure: Secondary | ICD-10-CM | POA: Diagnosis not present

## 2022-02-07 DIAGNOSIS — I428 Other cardiomyopathies: Secondary | ICD-10-CM | POA: Insufficient documentation

## 2022-02-07 DIAGNOSIS — I5022 Chronic systolic (congestive) heart failure: Secondary | ICD-10-CM | POA: Insufficient documentation

## 2022-02-07 DIAGNOSIS — Z79899 Other long term (current) drug therapy: Secondary | ICD-10-CM | POA: Insufficient documentation

## 2022-02-07 DIAGNOSIS — Z9882 Breast implant status: Secondary | ICD-10-CM | POA: Insufficient documentation

## 2022-02-07 DIAGNOSIS — T451X5A Adverse effect of antineoplastic and immunosuppressive drugs, initial encounter: Secondary | ICD-10-CM | POA: Diagnosis not present

## 2022-02-07 LAB — ECHOCARDIOGRAM COMPLETE
AR max vel: 3.79 cm2
AV Peak grad: 3.8 mmHg
Ao pk vel: 0.97 m/s
Area-P 1/2: 3.66 cm2
S' Lateral: 3.4 cm

## 2022-02-07 NOTE — Addendum Note (Signed)
Encounter addended by: Scarlette Calico, RN on: 02/07/2022 10:47 AM ? Actions taken: Clinical Note Signed

## 2022-02-07 NOTE — Progress Notes (Signed)
Medication Samples have been provided to the patient. ? ?Drug name: Jardiance       Strength: 10 mg        Qty: 4  LOT: [id number]  Exp.Date: 11/2023 ? ?Dosing instructions: Take 1 tablet daily ? ?The patient has been instructed regarding the correct time, dose, and frequency of taking this medication, including desired effects and most common side effects.  ? ?Juanita Laster Raiana Pharris ?12:00 PM ?02/07/2022 ? ?

## 2022-02-07 NOTE — Patient Instructions (Signed)
Your physician recommends that you schedule a follow-up appointment in: 1 year with an echocardiogram (March 2024), **PLEASE CALL OUR OFFICE IN January 2024 TO SCHEDULE THESE APPOINTMENTS ? ?If you have any questions or concerns before your next appointment please send [place] a message through [place] or call our office at [phone].   ? ?TO LEAVE A MESSAGE FOR THE NURSE SELECT OPTION 2, PLEASE LEAVE A MESSAGE INCLUDING: ?YOUR NAME ?DATE OF BIRTH ?CALL BACK NUMBER ?REASON FOR CALL**this is important as we prioritize the call backs ? ?YOU WILL RECEIVE A CALL BACK THE SAME DAY AS LONG AS YOU CALL BEFORE 4:00 PM ? ?If you have any questions or concerns before your next appointment please send [place] a message through [place] or call our office at [phone].   ? ?TO LEAVE A MESSAGE FOR THE NURSE SELECT OPTION 2, PLEASE LEAVE A MESSAGE INCLUDING: ?YOUR NAME ?DATE OF BIRTH ?CALL BACK NUMBER ?REASON FOR CALL**this is important as we prioritize the call backs ? ?YOU WILL RECEIVE A CALL BACK THE SAME DAY AS LONG AS YOU CALL BEFORE 4:00 PM ? ? ?

## 2022-02-07 NOTE — Addendum Note (Signed)
Encounter addended by: Stanford Scotland, RN on: 02/07/2022 12:05 PM ? Actions taken: Clinical Note Signed

## 2024-04-21 DEATH — deceased
